# Patient Record
Sex: Male | Born: 1960
Health system: Southern US, Community
[De-identification: ages and names within clinical notes are randomized; demographics above are authoritative.]

## PROBLEM LIST (undated history)

## (undated) DIAGNOSIS — R002 Palpitations: Secondary | ICD-10-CM

## (undated) DIAGNOSIS — I499 Cardiac arrhythmia, unspecified: Secondary | ICD-10-CM

## (undated) DIAGNOSIS — Z87898 Personal history of other specified conditions: Secondary | ICD-10-CM

## (undated) DIAGNOSIS — G4730 Sleep apnea, unspecified: Secondary | ICD-10-CM

## (undated) DIAGNOSIS — M1990 Unspecified osteoarthritis, unspecified site: Secondary | ICD-10-CM

## (undated) DIAGNOSIS — R0601 Orthopnea: Secondary | ICD-10-CM

## (undated) DIAGNOSIS — I10 Essential (primary) hypertension: Secondary | ICD-10-CM

## (undated) DIAGNOSIS — E119 Type 2 diabetes mellitus without complications: Secondary | ICD-10-CM

## (undated) DIAGNOSIS — R609 Edema, unspecified: Secondary | ICD-10-CM

## (undated) HISTORY — PX: HERNIA REPAIR: SHX51

---

## 2000-04-30 ENCOUNTER — Other Ambulatory Visit (HOSPITAL_COMMUNITY): Admission: RE | Admit: 2000-04-30 | Discharge: 2000-05-19 | Payer: Self-pay | Admitting: Psychiatry

## 2014-10-25 ENCOUNTER — Encounter: Payer: Self-pay | Admitting: *Deleted

## 2014-10-26 ENCOUNTER — Ambulatory Visit: Payer: 59 | Admitting: Anesthesiology

## 2014-10-26 ENCOUNTER — Encounter
Admission: RE | Disposition: A | Discharge status: Home / Self Care | Payer: Self-pay | Source: Ambulatory Visit | Attending: Gastroenterology

## 2014-10-26 ENCOUNTER — Ambulatory Visit
Admission: RE | Admit: 2014-10-26 | Discharge: 2014-10-26 | Disposition: A | Discharge status: Home / Self Care | Payer: 59 | Source: Ambulatory Visit | Attending: Gastroenterology | Admitting: Gastroenterology

## 2014-10-26 DIAGNOSIS — G4730 Sleep apnea, unspecified: Secondary | ICD-10-CM | POA: Diagnosis not present

## 2014-10-26 DIAGNOSIS — Z87891 Personal history of nicotine dependence: Secondary | ICD-10-CM | POA: Insufficient documentation

## 2014-10-26 DIAGNOSIS — I10 Essential (primary) hypertension: Secondary | ICD-10-CM | POA: Insufficient documentation

## 2014-10-26 DIAGNOSIS — E119 Type 2 diabetes mellitus without complications: Secondary | ICD-10-CM | POA: Diagnosis not present

## 2014-10-26 DIAGNOSIS — K621 Rectal polyp: Secondary | ICD-10-CM | POA: Insufficient documentation

## 2014-10-26 DIAGNOSIS — D125 Benign neoplasm of sigmoid colon: Secondary | ICD-10-CM | POA: Insufficient documentation

## 2014-10-26 DIAGNOSIS — I4891 Unspecified atrial fibrillation: Secondary | ICD-10-CM | POA: Diagnosis not present

## 2014-10-26 DIAGNOSIS — Z1211 Encounter for screening for malignant neoplasm of colon: Secondary | ICD-10-CM | POA: Insufficient documentation

## 2014-10-26 DIAGNOSIS — K5730 Diverticulosis of large intestine without perforation or abscess without bleeding: Secondary | ICD-10-CM | POA: Insufficient documentation

## 2014-10-26 DIAGNOSIS — Z79899 Other long term (current) drug therapy: Secondary | ICD-10-CM | POA: Diagnosis not present

## 2014-10-26 HISTORY — DX: Sleep apnea, unspecified: G47.30

## 2014-10-26 HISTORY — PX: COLONOSCOPY WITH PROPOFOL: SHX5780

## 2014-10-26 HISTORY — DX: Essential (primary) hypertension: I10

## 2014-10-26 HISTORY — DX: Cardiac arrhythmia, unspecified: I49.9

## 2014-10-26 HISTORY — DX: Type 2 diabetes mellitus without complications: E11.9

## 2014-10-26 LAB — URINE DRUG SCREEN, QUALITATIVE (ARMC ONLY)
Amphetamines, Ur Screen: NOT DETECTED
Barbiturates, Ur Screen: NOT DETECTED
Benzodiazepine, Ur Scrn: NOT DETECTED
COCAINE METABOLITE, UR ~~LOC~~: NOT DETECTED
Cannabinoid 50 Ng, Ur ~~LOC~~: NOT DETECTED
MDMA (Ecstasy)Ur Screen: NOT DETECTED
Methadone Scn, Ur: NOT DETECTED
OPIATE, UR SCREEN: NOT DETECTED
PHENCYCLIDINE (PCP) UR S: NOT DETECTED
TRICYCLIC, UR SCREEN: NOT DETECTED

## 2014-10-26 SURGERY — COLONOSCOPY WITH PROPOFOL
Anesthesia: General

## 2014-10-26 SURGERY — COLONOSCOPY WITH PROPOFOL
Anesthesia: Moderate Sedation

## 2014-10-26 MED ORDER — MIDAZOLAM HCL 5 MG/5ML IJ SOLN
INTRAMUSCULAR | Status: AC
  Filled 2014-10-26: qty 10

## 2014-10-26 MED ORDER — MIDAZOLAM HCL 10 MG/2ML IJ SOLN
INTRAMUSCULAR | Status: DC | PRN
  Administered 2014-10-26: 2 mg via INTRAVENOUS
  Administered 2014-10-26: 5 mg via INTRAVENOUS

## 2014-10-26 MED ORDER — SODIUM CHLORIDE 0.9 % IV SOLN
INTRAVENOUS | Status: DC
  Administered 2014-10-26: 1000 mL via INTRAVENOUS

## 2014-10-26 MED ORDER — FENTANYL CITRATE (PF) 100 MCG/2ML IJ SOLN
INTRAMUSCULAR | Status: DC | PRN
  Administered 2014-10-26 (×2): 50 ug via INTRAVENOUS

## 2014-10-26 MED ORDER — SODIUM CHLORIDE 0.9 % IV SOLN
INTRAVENOUS | Status: DC
Start: 2014-10-26 — End: 2014-10-26
  Administered 2014-10-26: 11:00:00 via INTRAVENOUS

## 2014-10-26 MED ORDER — FENTANYL CITRATE (PF) 100 MCG/2ML IJ SOLN
INTRAMUSCULAR | Status: AC
  Filled 2014-10-26: qty 2

## 2014-10-26 NOTE — Anesthesia Preprocedure Evaluation (Addendum)
Anesthesia Evaluation  Patient identified by MRN, date of birth, ID band Patient awake    Reviewed: Allergy & Precautions, H&P , NPO status , Patient's Chart, lab work & pertinent test results, reviewed documented beta blocker date and time   Airway Mallampati: II  TM Distance: >3 FB Neck ROM: full    Dental no notable dental hx. (+) Teeth Intact   Pulmonary sleep apnea , former smoker,  breath sounds clear to auscultation  Pulmonary exam normal       Cardiovascular hypertension, - Past MI Normal cardiovascular exam+ dysrhythmias Rhythm:regular Rate:Normal     Neuro/Psych negative neurological ROS  negative psych ROS   GI/Hepatic negative GI ROS, Neg liver ROS,   Endo/Other  negative endocrine ROSdiabetes  Renal/GU negative Renal ROS  negative genitourinary   Musculoskeletal   Abdominal   Peds  Hematology negative hematology ROS (+)   Anesthesia Other Findings   Reproductive/Obstetrics negative OB ROS                            Anesthesia Physical Anesthesia Plan  ASA: III  Anesthesia Plan: General and MAC   Post-op Pain Management:    Induction:   Airway Management Planned:   Additional Equipment:   Intra-op Plan:   Post-operative Plan:   Informed Consent: I have reviewed the patients History and Physical, chart, labs and discussed the procedure including the risks, benefits and alternatives for the proposed anesthesia with the patient or authorized representative who has indicated his/her understanding and acceptance.   Dental Advisory Given  Plan Discussed with: Anesthesiologist, CRNA and Surgeon  Anesthesia Plan Comments: (Patient requests to be awake and aware for the procedure)       Anesthesia Quick Evaluation

## 2014-10-26 NOTE — Op Note (Signed)
[facility] Gastroenterology Patient Name: Erik [name] Procedure Date: 10/26/2014 10:19 AM MRN: [id number] Account #: [id number] Date of Birth: [birth year] Admit Type: Outpatient Age: 54 Room: [facility] [place] ENDO ROOM 3 Gender: Male Note Status: Finalized Procedure:         Colonoscopy Indications:       Screening for colorectal malignant neoplasm, This is the                     patient's first colonoscopy Providers:         Gerrit Heck. Rayann Heman, MD Referring MD:      Youlanda Roys. Lovie Macadamia, MD (Referring MD) Medicines:         Fentanyl 100 micrograms IV, Midazolam 4 mg IV Complications:     No immediate complications. Procedure:         Pre-Anesthesia Assessment:                    - Prior to the procedure, a History and Physical was                     performed, and patient medications, allergies and                     sensitivities were reviewed. The patient's tolerance of                     previous anesthesia was reviewed.                    After obtaining informed consent, the colonoscope was                     passed under direct vision. Throughout the procedure, the                     patient's blood pressure, pulse, and oxygen saturations                     were monitored continuously. The Colonoscope was                     introduced through the anus and advanced to the the cecum,                     identified by appendiceal orifice and ileocecal valve. The                     colonoscopy was somewhat difficult due to significant                     looping. Successful completion of the procedure was aided                     by changing the patient to a prone position. The patient                     tolerated the procedure well. The quality of the bowel                     preparation was good except the cecum and ascending colon                     requred extensive lavage ( was good post lavage) Findings:      The perianal and digital  rectal  examinations were normal.      Many small-mouthed diverticula were found in the sigmoid colon.      A 3 mm polyp was found in the sigmoid colon. The polyp was sessile. The       polyp was removed with a jumbo cold forceps. Resection and retrieval       were complete.      A 2 mm polyp was found in the rectum. The polyp was sessile. The polyp       was removed with a jumbo cold forceps. Resection and retrieval were       complete.      The exam was otherwise without abnormality on direct and retroflexion       views. Impression:        - Diverticulosis in the sigmoid colon.                    - One 3 mm polyp in the sigmoid colon. Resected and                     retrieved.                    - One 2 mm polyp in the rectum. Resected and retrieved.                    - The examination was otherwise normal on direct and                     retroflexion views. Recommendation:    - Observe patient in GI recovery unit.                    - High fiber diet.                    - Continue present medications.                    - Await pathology results.                    - Repeat colonoscopy for surveillance based on pathology                     results, 5 years if adenomatous, otherwise repeat in 10                     years.                    - Return to referring physician.                    - The findings and recommendations were discussed with the                     patient.                    - The findings and recommendations were discussed with the                     patient's family. Procedure Code(s): --- Professional ---                    [phone], Colonoscopy, flexible; with biopsy, single or  multiple CPT copyright 2014 American Medical Association. All rights reserved. The codes documented in this report are preliminary and upon coder review may  be revised to meet current compliance requirements. Mellody Life, MD 10/26/2014 11:27:49 AM This report has  been signed electronically. Number of Addenda: 0 Note Initiated On: 10/26/2014 10:19 AM Scope Withdrawal Time: 0 hours 18 minutes 12 seconds  Total Procedure Duration: 0 hours 31 minutes 46 seconds       [facility]

## 2014-10-26 NOTE — Discharge Instructions (Signed)

## 2014-10-26 NOTE — H&P (Signed)
  Primary Care Physician:  Erik Pitch, MD  Pre-Procedure History & Physical: HPI:  Erik [name] is a 54 y.o. male is here for an colonoscopy.   Past Medical History  Diagnosis Date  . Hypertension   . Diabetes mellitus without complication   . Sleep apnea   . Dysrhythmia     a-fib    Past Surgical History  Procedure Laterality Date  . Hernia repair      Prior to Admission medications   Medication Sig Start Date End Date Taking? Authorizing Provider  amLODipine (NORVASC) 10 MG tablet Take 10 mg by mouth daily.   Yes Historical Provider, MD  glucosamine-chondroitin 500-400 MG tablet Take 1 tablet by mouth 3 (three) times daily.   Yes Historical Provider, MD  lisinopril-hydrochlorothiazide (PRINZIDE,ZESTORETIC) 20-12.5 MG per tablet Take 1 tablet by mouth daily.   Yes Historical Provider, MD  metFORMIN (GLUCOPHAGE) 500 MG tablet Take by mouth 2 (two) times daily with a meal.   Yes Historical Provider, MD  metoprolol (LOPRESSOR) 100 MG tablet Take 100 mg by mouth 2 (two) times daily.   Yes Historical Provider, MD  Multiple Vitamins-Minerals (MULTIVITAMIN WITH MINERALS) tablet Take 1 tablet by mouth daily.   Yes Historical Provider, MD  simvastatin (ZOCOR) 20 MG tablet Take 20 mg by mouth daily.   Yes Historical Provider, MD    Allergies as of 10/18/2014  . (Not on File)    History reviewed. No pertinent family history.  History   Social History  . Marital Status: Married    Spouse Name: N/A  . Number of Children: N/A  . Years of Education: N/A   Occupational History  . Not on file.   Social History Main Topics  . Smoking status: Former Research scientist (life sciences)  . Smokeless tobacco: Not on file  . Alcohol Use: Not on file  . Drug Use: Not on file  . Sexual Activity: Not on file   Other Topics Concern  . Not on file   Social History Narrative     Physical Exam: BP 146/86 mmHg  Pulse 74  Temp(Src) 97.9 F (36.6 C) (Tympanic)  Resp 18  Ht 6\' 2"  (1.88 m)  Wt  158.759 kg (350 lb)  BMI 44.92 kg/m2  SpO2 97% General:   Alert,  pleasant and cooperative in NAD Head:  Normocephalic and atraumatic. Neck:  Supple; no masses or thyromegaly. Lungs:  Clear throughout to auscultation.    Heart:  Regular rate and rhythm. Abdomen:  Soft, nontender and nondistended. Normal bowel sounds, without guarding, and without rebound.   Neurologic:  Alert and  oriented x4;  grossly normal neurologically.  Impression/Plan: Erik [name] is here for an colonoscopy to be performed for screening.   Risks, benefits, limitations, and alternatives regarding  colonoscopy have been reviewed with the patient.  Questions have been answered.  All parties agreeable.   Josefine Class, MD  10/26/2014, 10:23 AM

## 2014-10-27 ENCOUNTER — Encounter: Payer: Self-pay | Admitting: Gastroenterology

## 2014-10-27 LAB — SURGICAL PATHOLOGY

## 2015-07-17 ENCOUNTER — Encounter: Payer: 59 | Attending: Family Medicine | Admitting: Dietician

## 2015-07-17 ENCOUNTER — Encounter: Payer: Self-pay | Admitting: Dietician

## 2015-07-17 VITALS — Ht 74.0 in | Wt 329.9 lb

## 2015-07-17 DIAGNOSIS — E119 Type 2 diabetes mellitus without complications: Secondary | ICD-10-CM | POA: Diagnosis present

## 2015-07-17 DIAGNOSIS — E669 Obesity, unspecified: Secondary | ICD-10-CM | POA: Diagnosis present

## 2015-07-17 NOTE — Patient Instructions (Signed)
Balance meals with protein, 3-4 servings of carbohydrate and non-starchy vegetables with small amounts of added fat. Include at least 1-2 starch servings per meal. Include only 1 fruit serving per meal and snack. Continue consistent exercise of 6 days per week.

## 2015-07-17 NOTE — Progress Notes (Signed)
Medical Nutrition Therapy: Visit start time: 8:50  end time: 9:50am  Assessment:  Diagnosis: obesity, Type 2 diabetes Past medical history: hyperlipidemia, hypertension, atrial fibrillation, sleep apnea. Patient has history of alcohol and drug abuse and reports he has been alcohol and drug free for 15 months. He attends alcoholics and narcotics anonymous. Psychosocial issues/ stress concerns: Patient rates his stress as "moderate" and indicates "ok" as to how well he is dealing with his stress.  Preferred learning method:  . Auditory    . Visual . Hands-on Current weight: 329.9 lbs  Height: 74 in Medications, supplements: see list Progress and evaluation:  Patient in for initial medical nutrition therapy appointment. He reports history of "on and off" dieting. He reports a highest weight of 425 lbs "years ago". Reports a weight loss of 20 lbs since first of this year. He reports that since being alcohol and drug free he has turned his addiction to food. He joined [employer] and has found this helpful. He uses a Fitbit to record food/beverage intake. His food records indicate calories ranging from 1300 -2600 per day. He is possibly over-restricting especially in his carbohydrate intake on some days which could increase risk of rebound effect and excessive intake. He states he tries to not eat after 3:30pm and goes to bed by 9:30kpm.  He has eliminated most sweets and most of his food choices are providing nutrients. His goal for this appointment is "tweeking my program". Reports his most recent HgA1c was 9.4 and his fasting blood sugars are now in 100-160 range, a big improvement since the 1st of the year. Physical activity: walking 6 days per week for 1-1.5 hours  Dietary Intake:  Usual eating pattern includes 3 meals and 2-3 snacks per day. Dining out frequency: 5-6 meals per week.  Breakfast: oatmeal (1 pkg), 1/2 cup blueberries, 1 large banana, 1 egg Snack: kiwi, banana Lunch: bacon  grilled chicken salad, fruit Snack:pecans, fruit Supper: broccoli, fruit, 1 egg  Nutrition Care Education:  Weight control: Discussed components of successful weight loss including diet, exercise and stress/emotional support and commended on including all 3 in his efforts. Discussed the need for adequate calories and balance of protein, carbohydrate, fat and non-starchy vegetables. Encouraged to focus on food group servings needed to meet basic nutrient needs verses restriction. Diabetes:  Instructed on carbohydrate counting and used food guide plate and "Planning a Balanced Meal" and menu examples to show examples of balancing nutrients and servings needed to meet basic nutrient needs. Recommended meal plan based on minimum of 1900 -2000 calories per day to avoid over-restricting. Nutritional Diagnosis:  NI-5.8.1 Inadequate carbohydrate intake As related to over-restricting at meals and snacks.  As evidenced by diet history..  Intervention:  Balance meals with protein, 3-4 servings of carbohydrate and non-starchy vegetables with small amounts of added fat. Include at least 1-2 starch servings per meal. Include only 1 fruit serving per meal and snack. Continue consistent exercise of 6 days per week.  Education Materials given:  . Plate Planner . Food lists/ Planning A Balanced Meal . Sample meal pattern/ menus . Goals/ instructions  Learner/ who was taught:  . Patient  Level of understanding: . Partial understanding; needs review/ practice Learning barriers: . None Follow-up: exercise, weight, diet  08/14/15 at 9:00am

## 2015-08-14 ENCOUNTER — Ambulatory Visit: Payer: 59 | Admitting: Dietician

## 2015-09-06 ENCOUNTER — Encounter: Payer: Self-pay | Admitting: *Deleted

## 2015-09-09 NOTE — H&P (Signed)
See scanned note.

## 2015-09-10 ENCOUNTER — Ambulatory Visit
Admission: RE | Admit: 2015-09-10 | Discharge: 2015-09-10 | Disposition: A | Discharge status: Home / Self Care | Payer: 59 | Source: Ambulatory Visit | Attending: Ophthalmology | Admitting: Ophthalmology

## 2015-09-10 ENCOUNTER — Encounter: Payer: Self-pay | Admitting: *Deleted

## 2015-09-10 ENCOUNTER — Ambulatory Visit: Payer: 59 | Admitting: Anesthesiology

## 2015-09-10 ENCOUNTER — Encounter
Admission: RE | Disposition: A | Discharge status: Home / Self Care | Payer: Self-pay | Source: Ambulatory Visit | Attending: Ophthalmology

## 2015-09-10 DIAGNOSIS — M7989 Other specified soft tissue disorders: Secondary | ICD-10-CM | POA: Insufficient documentation

## 2015-09-10 DIAGNOSIS — R0601 Orthopnea: Secondary | ICD-10-CM | POA: Diagnosis not present

## 2015-09-10 DIAGNOSIS — I4891 Unspecified atrial fibrillation: Secondary | ICD-10-CM | POA: Diagnosis not present

## 2015-09-10 DIAGNOSIS — Z87891 Personal history of nicotine dependence: Secondary | ICD-10-CM | POA: Insufficient documentation

## 2015-09-10 DIAGNOSIS — H25042 Posterior subcapsular polar age-related cataract, left eye: Secondary | ICD-10-CM | POA: Insufficient documentation

## 2015-09-10 DIAGNOSIS — G4730 Sleep apnea, unspecified: Secondary | ICD-10-CM | POA: Insufficient documentation

## 2015-09-10 DIAGNOSIS — I10 Essential (primary) hypertension: Secondary | ICD-10-CM | POA: Diagnosis not present

## 2015-09-10 DIAGNOSIS — E7800 Pure hypercholesterolemia, unspecified: Secondary | ICD-10-CM | POA: Diagnosis not present

## 2015-09-10 DIAGNOSIS — H268 Other specified cataract: Secondary | ICD-10-CM | POA: Diagnosis present

## 2015-09-10 DIAGNOSIS — H2512 Age-related nuclear cataract, left eye: Secondary | ICD-10-CM | POA: Insufficient documentation

## 2015-09-10 DIAGNOSIS — R002 Palpitations: Secondary | ICD-10-CM | POA: Insufficient documentation

## 2015-09-10 DIAGNOSIS — M1990 Unspecified osteoarthritis, unspecified site: Secondary | ICD-10-CM | POA: Insufficient documentation

## 2015-09-10 DIAGNOSIS — E119 Type 2 diabetes mellitus without complications: Secondary | ICD-10-CM | POA: Diagnosis not present

## 2015-09-10 HISTORY — DX: Palpitations: R00.2

## 2015-09-10 HISTORY — DX: Edema, unspecified: R60.9

## 2015-09-10 HISTORY — DX: Unspecified osteoarthritis, unspecified site: M19.90

## 2015-09-10 HISTORY — PX: CATARACT EXTRACTION W/PHACO: SHX586

## 2015-09-10 HISTORY — DX: Personal history of other specified conditions: Z87.898

## 2015-09-10 LAB — GLUCOSE, CAPILLARY: Glucose-Capillary: 156 mg/dL — ABNORMAL HIGH (ref 65–99)

## 2015-09-10 SURGERY — PHACOEMULSIFICATION, CATARACT, WITH IOL INSERTION
Anesthesia: Monitor Anesthesia Care | Site: Eye | Laterality: Left | Wound class: Clean

## 2015-09-10 MED ORDER — CEFUROXIME OPHTHALMIC INJECTION 1 MG/0.1 ML
INJECTION | OPHTHALMIC | Status: AC
  Filled 2015-09-10: qty 0.1

## 2015-09-10 MED ORDER — PHENYLEPHRINE HCL 10 % OP SOLN
OPHTHALMIC | Status: AC
  Administered 2015-09-10: 1 [drp] via OPHTHALMIC
  Filled 2015-09-10: qty 5

## 2015-09-10 MED ORDER — CYCLOPENTOLATE HCL 2 % OP SOLN
1.0000 [drp] | OPHTHALMIC | Status: AC | PRN
  Administered 2015-09-10 (×4): 1 [drp] via OPHTHALMIC

## 2015-09-10 MED ORDER — EPINEPHRINE HCL 1 MG/ML IJ SOLN
INTRAOCULAR | Status: DC | PRN
  Administered 2015-09-10: 1 mL via OPHTHALMIC

## 2015-09-10 MED ORDER — BUPIVACAINE HCL (PF) 0.75 % IJ SOLN
INTRAMUSCULAR | Status: AC
  Filled 2015-09-10: qty 10

## 2015-09-10 MED ORDER — POVIDONE-IODINE 5 % OP SOLN
OPHTHALMIC | Status: AC
  Filled 2015-09-10: qty 30

## 2015-09-10 MED ORDER — MIDAZOLAM HCL 5 MG/5ML IJ SOLN
INTRAMUSCULAR | Status: DC | PRN
  Administered 2015-09-10: 1 mg via INTRAVENOUS

## 2015-09-10 MED ORDER — TETRACAINE HCL 0.5 % OP SOLN
OPHTHALMIC | Status: AC
  Filled 2015-09-10: qty 2

## 2015-09-10 MED ORDER — HYALURONIDASE HUMAN 150 UNIT/ML IJ SOLN
INTRAMUSCULAR | Status: AC
  Filled 2015-09-10: qty 1

## 2015-09-10 MED ORDER — CARBACHOL 0.01 % IO SOLN
INTRAOCULAR | Status: DC | PRN
  Administered 2015-09-10: .5 mL via INTRAOCULAR

## 2015-09-10 MED ORDER — MOXIFLOXACIN HCL 0.5 % OP SOLN
1.0000 [drp] | OPHTHALMIC | Status: DC | PRN
  Administered 2015-09-10 (×3): 1 [drp] via OPHTHALMIC

## 2015-09-10 MED ORDER — ALFENTANIL 500 MCG/ML IJ INJ
INJECTION | INTRAMUSCULAR | Status: DC | PRN
  Administered 2015-09-10: 500 ug via INTRAVENOUS

## 2015-09-10 MED ORDER — EPINEPHRINE HCL 1 MG/ML IJ SOLN
INTRAMUSCULAR | Status: AC
  Filled 2015-09-10: qty 2

## 2015-09-10 MED ORDER — MOXIFLOXACIN HCL 0.5 % OP SOLN
OPHTHALMIC | Status: AC
  Administered 2015-09-10: 1 [drp] via OPHTHALMIC
  Filled 2015-09-10: qty 3

## 2015-09-10 MED ORDER — LIDOCAINE HCL (PF) 4 % IJ SOLN
INTRAMUSCULAR | Status: AC
  Filled 2015-09-10: qty 5

## 2015-09-10 MED ORDER — MOXIFLOXACIN HCL 0.5 % OP SOLN
OPHTHALMIC | Status: DC | PRN
  Administered 2015-09-10: 1 [drp] via OPHTHALMIC

## 2015-09-10 MED ORDER — LIDOCAINE HCL (PF) 4 % IJ SOLN
INTRAMUSCULAR | Status: DC | PRN
  Administered 2015-09-10: 4 mL via OPHTHALMIC

## 2015-09-10 MED ORDER — SODIUM CHLORIDE 0.9 % IV SOLN
INTRAVENOUS | Status: DC
  Administered 2015-09-10: 09:00:00 via INTRAVENOUS

## 2015-09-10 MED ORDER — NA CHONDROIT SULF-NA HYALURON 40-17 MG/ML IO SOLN
INTRAOCULAR | Status: AC
  Filled 2015-09-10: qty 1

## 2015-09-10 MED ORDER — PHENYLEPHRINE HCL 10 % OP SOLN
1.0000 [drp] | OPHTHALMIC | Status: AC | PRN
  Administered 2015-09-10 (×4): 1 [drp] via OPHTHALMIC

## 2015-09-10 MED ORDER — NA CHONDROIT SULF-NA HYALURON 40-17 MG/ML IO SOLN
INTRAOCULAR | Status: DC | PRN
  Administered 2015-09-10: 1 mL via INTRAOCULAR

## 2015-09-10 MED ORDER — CEFUROXIME OPHTHALMIC INJECTION 1 MG/0.1 ML
INJECTION | OPHTHALMIC | Status: DC | PRN
  Administered 2015-09-10: .1 mL via INTRACAMERAL

## 2015-09-10 MED ORDER — POVIDONE-IODINE 5 % OP SOLN
OPHTHALMIC | Status: DC | PRN
  Administered 2015-09-10: 1 via OPHTHALMIC

## 2015-09-10 MED ORDER — TETRACAINE HCL 0.5 % OP SOLN
OPHTHALMIC | Status: DC | PRN
  Administered 2015-09-10: 1 [drp] via OPHTHALMIC

## 2015-09-10 MED ORDER — CYCLOPENTOLATE HCL 2 % OP SOLN
OPHTHALMIC | Status: AC
  Administered 2015-09-10: 1 [drp] via OPHTHALMIC
  Filled 2015-09-10: qty 2

## 2015-09-10 MED ORDER — LIDOCAINE HCL (PF) 4 % IJ SOLN
INTRAOCULAR | Status: DC | PRN
  Administered 2015-09-10: .5 mL via OPHTHALMIC

## 2015-09-10 SURGICAL SUPPLY — 30 items
CANNULA ANT/CHMB 27G (MISCELLANEOUS) ×1 IMPLANT
CANNULA ANT/CHMB 27GA (MISCELLANEOUS) ×2 IMPLANT
CORD BIP STRL DISP 12FT (MISCELLANEOUS) ×2 IMPLANT
CUP MEDICINE 2OZ PLAST GRAD ST (MISCELLANEOUS) ×2 IMPLANT
DRAPE XRAY CASSETTE 23X24 (DRAPES) ×2 IMPLANT
ERASER HMR WETFIELD 18G (MISCELLANEOUS) ×2 IMPLANT
GLOVE BIO SURGEON STRL SZ8 (GLOVE) ×2 IMPLANT
GLOVE SURG LX 6.5 MICRO (GLOVE) ×1
GLOVE SURG LX 8.0 MICRO (GLOVE) ×1
GLOVE SURG LX STRL 6.5 MICRO (GLOVE) ×1 IMPLANT
GLOVE SURG LX STRL 8.0 MICRO (GLOVE) ×1 IMPLANT
GOWN STRL REUS W/ TWL LRG LVL3 (GOWN DISPOSABLE) ×1 IMPLANT
GOWN STRL REUS W/ TWL XL LVL3 (GOWN DISPOSABLE) ×1 IMPLANT
GOWN STRL REUS W/TWL LRG LVL3 (GOWN DISPOSABLE) ×2
GOWN STRL REUS W/TWL XL LVL3 (GOWN DISPOSABLE) ×2
LENS IOL ACRYSOF IQ 20.0 (Intraocular Lens) ×1 IMPLANT
PACK CATARACT (MISCELLANEOUS) ×2 IMPLANT
PACK CATARACT DINGLEDEIN LX (MISCELLANEOUS) ×2 IMPLANT
PACK EYE AFTER SURG (MISCELLANEOUS) ×2 IMPLANT
SHLD EYE VISITEC  UNIV (MISCELLANEOUS) ×2 IMPLANT
SOL BSS BAG (MISCELLANEOUS) ×2
SOL PREP PVP 2OZ (MISCELLANEOUS) ×2
SOLUTION BSS BAG (MISCELLANEOUS) ×1 IMPLANT
SOLUTION PREP PVP 2OZ (MISCELLANEOUS) ×1 IMPLANT
SUT SILK 5-0 (SUTURE) ×2 IMPLANT
SYR 3ML LL SCALE MARK (SYRINGE) ×2 IMPLANT
SYR 5ML LL (SYRINGE) ×2 IMPLANT
SYR TB 1ML 27GX1/2 LL (SYRINGE) ×2 IMPLANT
WATER STERILE IRR 1000ML POUR (IV SOLUTION) ×2 IMPLANT
WIPE NON LINTING 3.25X3.25 (MISCELLANEOUS) ×2 IMPLANT

## 2015-09-10 NOTE — Anesthesia Postprocedure Evaluation (Signed)
Anesthesia Post Note  Patient: Erik [name]  Procedure(s) Performed: Procedure(s) (LRB): CATARACT EXTRACTION PHACO AND INTRAOCULAR LENS PLACEMENT ([facility]) (Left)  Patient location during evaluation: PACU Anesthesia Type: MAC Level of consciousness: awake and alert and oriented Pain management: satisfactory to patient Vital Signs Assessment: post-procedure vital signs reviewed and stable Respiratory status: respiratory function stable Cardiovascular status: stable Anesthetic complications: no    Last Vitals:  Filed Vitals:   09/10/15 0821  BP: 145/81  Pulse: 66  Temp: 37 C  Resp: 18    Last Pain: There were no vitals filed for this visit.               Silvana Newness A

## 2015-09-10 NOTE — Discharge Instructions (Signed)
AMBULATORY SURGERY  DISCHARGE INSTRUCTIONS   1) The drugs that you were given will stay in your system until tomorrow so for the next 24 hours you should not:  A) Drive an automobile B) Make any legal decisions C) Drink any alcoholic beverage   2) You may resume regular meals tomorrow.  Today it is better to start with liquids and gradually work up to solid foods.  You may eat anything you prefer, but it is better to start with liquids, then soup and crackers, and gradually work up to solid foods.   3) Please notify your doctor immediately if you have any unusual bleeding, trouble breathing, redness and pain at the surgery site, drainage, fever, or pain not relieved by medication.    4) Additional Instructions:        Please contact your physician with any problems or Same Day Surgery at [phone], Monday through Friday 6 am to 4 pm, or [place] at [facility] number at [phone]. Eye Surgery Discharge Instructions  Expect mild scratchy sensation or mild soreness. DO NOT RUB YOUR EYE!  The day of surgery:  Minimal physical activity, but bed rest is not required  No reading, computer work, or close hand work  No bending, lifting, or straining.  May watch TV  For 24 hours:  No driving, legal decisions, or alcoholic beverages  Safety precautions  Eat anything you prefer: It is better to start with liquids, then soup then solid foods.  _____ Eye patch should be worn until postoperative exam tomorrow.  ____ Solar shield eyeglasses should be worn for comfort in the sunlight/patch while sleeping  Resume all regular medications including aspirin or Coumadin if these were discontinued prior to surgery. You may shower, bathe, shave, or wash your hair. Tylenol may be taken for mild discomfort.  Call your doctor if you experience significant pain, nausea, or vomiting, fever > 101 or other signs of infection. [phone] or [phone] Specific  instructions:  Follow-up Information    Follow up with Estill Cotta, MD.   Specialty:  Ophthalmology   Why:  follow up 5/23 at [place] information:   [address]   [place] [address] [phone]

## 2015-09-10 NOTE — Interval H&P Note (Signed)
History and Physical Interval Note:  09/10/2015 9:32 AM  Erik [name]  has presented today for surgery, with the diagnosis of cataract  The various methods of treatment have been discussed with the patient and family. After consideration of risks, benefits and other options for treatment, the patient has consented to  Procedure(s): CATARACT EXTRACTION PHACO AND INTRAOCULAR LENS PLACEMENT ([place]) (Left) as a surgical intervention .  The patient's history has been reviewed, patient examined, no change in status, stable for surgery.  I have reviewed the patient's chart and labs.  Questions were answered to the patient's satisfaction.     Mihcael Ledee

## 2015-09-10 NOTE — Op Note (Signed)
Date of Surgery: 09/10/2015 Date of Dictation: 09/10/2015 10:23 AM Pre-operative Diagnosis:  Nuclear Sclerotic Cataract and Posterior Subcapsular Cataract left Eye Post-operative Diagnosis: same Procedure performed: Extra-capsular Cataract Extraction (ECCE) with placement of a posterior chamber intraocular lens (IOL) left Eye IOL:  Implant Name Type Inv. Item Serial No. Manufacturer Lot No. LRB No. Used  LENS IOL ACRYSOF IQ 20.0 - [id number] Intraocular Lens LENS IOL ACRYSOF IQ 20.0 [id number] ALCON   Left 1   Anesthesia: 2% Lidocaine and 4% Marcaine in a 50/50 mixture with 10 unites/ml of Hylenex given as a peribulbar Anesthesiologist: Anesthesiologist: Gunnar Fusi, MD CRNA: Silvana Newness, CRNA Complications: none Estimated Blood Loss: less than 1 ml  Description of procedure:  The patient was given anesthesia and sedation via intravenous access. The patient was then prepped and draped in the usual fashion. A 25-gauge needle was bent for initiating the capsulorhexis. A 5-0 silk suture was placed through the conjunctiva superior and inferiorly to serve as bridle sutures. Hemostasis was obtained at the superior limbus using an eraser cautery. A partial thickness groove was made at the anterior surgical limbus with a 64 Beaver blade and this was dissected anteriorly with an [employer]aya. The anterior chamber was entered at 10 o'clock with a 1.0 mm paracentesis knife and through the lamellar dissection with a 2.6 mm Alcon keratome. Epi-Shugarcaine 0.5 CC [9 cc BSS Plus (Alcon), 3 cc 4% preservative-free lidocaine (Hospira) and 4 cc 1:1000 preservative-free, bisulfite-free epinephrine] was injected into the anterior chamber via the paracentesis tract. Epi-Shugarcaine 0.5 CC [9 cc BSS Plus (Alcon), 3 cc 4% preservative-free lidocaine (Hospira) and 4 cc 1:1000 preservative-free, bisulfite-free epinephrine] was injected into the anterior chamber via the paracentesis tract. DiscoVisc was  injected to replace the aqueous and a continuous tear curvilinear capsulorhexis was performed using a bent 25-gauge needle.  Balance salt on a syringe was used to perform hydro-dissection and phacoemulsification was carried out using a divide and conquer technique. Procedure(s) with comments: CATARACT EXTRACTION PHACO AND INTRAOCULAR LENS PLACEMENT (IOC) (Left) - [place] 00:51 AP% 15.8 CDE 16.04 fluid pack lot # [id number]. Irrigation/aspiration was used to remove the residual cortex and the capsular bag was inflated with DiscoVisc. The intraocular lens was inserted into the capsular bag using a pre-loaded UltraSert Delivery System. Irrigation/aspiration was used to remove the residual DiscoVisc. The wound was inflated with balanced salt and checked for leaks. None were found. Miostat was injected via the paracentesis track and 0.1 ml of cefuroxime containing 1 mg of drug  was injected via the paracentesis track. The wound was checked for leaks again and none were found.   The bridal sutures were removed and two drops of Vigamox were placed on the eye. An eye shield was placed to protect the eye and the patient was discharged to the recovery area in good condition.   Lisaann Atha MD

## 2015-09-10 NOTE — Transfer of Care (Signed)
Immediate Anesthesia Transfer of Care Note  Patient: Dmitriy Grabe  Procedure(s) Performed: Procedure(s) with comments: CATARACT EXTRACTION PHACO AND INTRAOCULAR LENS PLACEMENT (IOC) (Left) - [place] 00:51 AP% 15.8 CDE 16.04 fluid pack lot # [id number]  Patient Location: PACU  Anesthesia Type:MAC  Level of Consciousness: awake, alert , oriented and patient cooperative  Airway & Oxygen Therapy: Patient Spontanous Breathing  Post-op Assessment: Report given to RN, Post -op Vital signs reviewed and stable and Patient moving all extremities X 4  Post vital signs: Reviewed and stable  Last Vitals:  Filed Vitals:   09/10/15 0821  BP: 145/81  Pulse: 66  Temp: 37 C  Resp: 18    Last Pain: There were no vitals filed for this visit.       Complications: No apparent anesthesia complications

## 2015-09-10 NOTE — Anesthesia Preprocedure Evaluation (Signed)
Anesthesia Evaluation  Patient identified by MRN, date of birth, ID band Patient awake    Reviewed: Allergy & Precautions, NPO status , Patient's Chart, lab work & pertinent test results  Airway Mallampati: II       Dental   Pulmonary neg pulmonary ROS, sleep apnea , former smoker,           Cardiovascular hypertension, Pt. on medications + dysrhythmias Atrial Fibrillation      Neuro/Psych negative neurological ROS     GI/Hepatic Neg liver ROS,   Endo/Other  diabetes, Type 2, Oral Hypoglycemic Agents  Renal/GU negative Renal ROS     Musculoskeletal   Abdominal   Peds  Hematology negative hematology ROS (+)   Anesthesia Other Findings   Reproductive/Obstetrics                             Anesthesia Physical Anesthesia Plan  ASA: II  Anesthesia Plan: MAC   Post-op Pain Management:    Induction: Intravenous  Airway Management Planned: Nasal Cannula  Additional Equipment:   Intra-op Plan:   Post-operative Plan:   Informed Consent: I have reviewed the patients History and Physical, chart, labs and discussed the procedure including the risks, benefits and alternatives for the proposed anesthesia with the patient or authorized representative who has indicated his/her understanding and acceptance.     Plan Discussed with:   Anesthesia Plan Comments:         Anesthesia Quick Evaluation

## 2015-09-18 ENCOUNTER — Ambulatory Visit: Payer: 59 | Admitting: Dietician

## 2015-09-26 ENCOUNTER — Encounter: Payer: Self-pay | Admitting: *Deleted

## 2015-09-30 NOTE — H&P (Signed)
See scanned note.

## 2015-10-01 ENCOUNTER — Ambulatory Visit: Payer: 59 | Admitting: Anesthesiology

## 2015-10-01 ENCOUNTER — Ambulatory Visit
Admission: RE | Admit: 2015-10-01 | Discharge: 2015-10-01 | Disposition: A | Discharge status: Home / Self Care | Payer: 59 | Source: Ambulatory Visit | Attending: Ophthalmology | Admitting: Ophthalmology

## 2015-10-01 ENCOUNTER — Encounter: Payer: Self-pay | Admitting: *Deleted

## 2015-10-01 ENCOUNTER — Encounter
Admission: RE | Disposition: A | Discharge status: Home / Self Care | Payer: Self-pay | Source: Ambulatory Visit | Attending: Ophthalmology

## 2015-10-01 DIAGNOSIS — E7800 Pure hypercholesterolemia, unspecified: Secondary | ICD-10-CM | POA: Diagnosis not present

## 2015-10-01 DIAGNOSIS — Z87891 Personal history of nicotine dependence: Secondary | ICD-10-CM | POA: Diagnosis not present

## 2015-10-01 DIAGNOSIS — H2511 Age-related nuclear cataract, right eye: Secondary | ICD-10-CM | POA: Diagnosis not present

## 2015-10-01 DIAGNOSIS — Z9889 Other specified postprocedural states: Secondary | ICD-10-CM | POA: Insufficient documentation

## 2015-10-01 DIAGNOSIS — H269 Unspecified cataract: Secondary | ICD-10-CM | POA: Diagnosis present

## 2015-10-01 DIAGNOSIS — M1990 Unspecified osteoarthritis, unspecified site: Secondary | ICD-10-CM | POA: Insufficient documentation

## 2015-10-01 DIAGNOSIS — E119 Type 2 diabetes mellitus without complications: Secondary | ICD-10-CM | POA: Diagnosis not present

## 2015-10-01 DIAGNOSIS — G4730 Sleep apnea, unspecified: Secondary | ICD-10-CM | POA: Insufficient documentation

## 2015-10-01 DIAGNOSIS — H25041 Posterior subcapsular polar age-related cataract, right eye: Secondary | ICD-10-CM | POA: Diagnosis not present

## 2015-10-01 DIAGNOSIS — Z79899 Other long term (current) drug therapy: Secondary | ICD-10-CM | POA: Diagnosis not present

## 2015-10-01 DIAGNOSIS — I4891 Unspecified atrial fibrillation: Secondary | ICD-10-CM | POA: Insufficient documentation

## 2015-10-01 DIAGNOSIS — Z7984 Long term (current) use of oral hypoglycemic drugs: Secondary | ICD-10-CM | POA: Diagnosis not present

## 2015-10-01 HISTORY — DX: Orthopnea: R06.01

## 2015-10-01 HISTORY — PX: CATARACT EXTRACTION W/PHACO: SHX586

## 2015-10-01 LAB — GLUCOSE, CAPILLARY: GLUCOSE-CAPILLARY: 170 mg/dL — AB (ref 65–99)

## 2015-10-01 SURGERY — PHACOEMULSIFICATION, CATARACT, WITH IOL INSERTION
Anesthesia: Monitor Anesthesia Care | Site: Eye | Laterality: Right | Wound class: Clean

## 2015-10-01 MED ORDER — POVIDONE-IODINE 5 % OP SOLN
OPHTHALMIC | Status: DC | PRN
  Administered 2015-10-01: 1 via OPHTHALMIC

## 2015-10-01 MED ORDER — SODIUM CHLORIDE 0.9 % IV SOLN
INTRAVENOUS | Status: DC
  Administered 2015-10-01 (×2): via INTRAVENOUS

## 2015-10-01 MED ORDER — ALFENTANIL 500 MCG/ML IJ INJ
INJECTION | INTRAMUSCULAR | Status: DC | PRN
Start: 2015-10-01 — End: 2015-10-01
  Administered 2015-10-01: 750 ug via INTRAVENOUS

## 2015-10-01 MED ORDER — MOXIFLOXACIN HCL 0.5 % OP SOLN
OPHTHALMIC | Status: AC
  Filled 2015-10-01: qty 3

## 2015-10-01 MED ORDER — LIDOCAINE HCL (PF) 4 % IJ SOLN
INTRAMUSCULAR | Status: AC
  Filled 2015-10-01: qty 5

## 2015-10-01 MED ORDER — MOXIFLOXACIN HCL 0.5 % OP SOLN
OPHTHALMIC | Status: DC | PRN
  Administered 2015-10-01: 1 [drp] via OPHTHALMIC

## 2015-10-01 MED ORDER — EPINEPHRINE HCL 1 MG/ML IJ SOLN
INTRAOCULAR | Status: DC | PRN
  Administered 2015-10-01: 09:00:00 via OPHTHALMIC

## 2015-10-01 MED ORDER — CYCLOPENTOLATE HCL 2 % OP SOLN
OPHTHALMIC | Status: AC
  Filled 2015-10-01: qty 2

## 2015-10-01 MED ORDER — CYCLOPENTOLATE HCL 2 % OP SOLN
1.0000 [drp] | OPHTHALMIC | Status: DC | PRN
  Administered 2015-10-01: 1 [drp] via OPHTHALMIC

## 2015-10-01 MED ORDER — TETRACAINE HCL 0.5 % OP SOLN
OPHTHALMIC | Status: DC | PRN
  Administered 2015-10-01: 1 [drp] via OPHTHALMIC

## 2015-10-01 MED ORDER — PHENYLEPHRINE HCL 10 % OP SOLN
OPHTHALMIC | Status: AC
  Filled 2015-10-01: qty 5

## 2015-10-01 MED ORDER — EPINEPHRINE HCL 1 MG/ML IJ SOLN
INTRAMUSCULAR | Status: AC
  Filled 2015-10-01: qty 1

## 2015-10-01 MED ORDER — LIDOCAINE HCL (PF) 4 % IJ SOLN
INTRAOCULAR | Status: DC | PRN
  Administered 2015-10-01: 09:00:00 via OPHTHALMIC

## 2015-10-01 MED ORDER — HYALURONIDASE HUMAN 150 UNIT/ML IJ SOLN
INTRAMUSCULAR | Status: AC
  Filled 2015-10-01: qty 1

## 2015-10-01 MED ORDER — CEFUROXIME OPHTHALMIC INJECTION 1 MG/0.1 ML
INJECTION | OPHTHALMIC | Status: AC
  Filled 2015-10-01: qty 0.1

## 2015-10-01 MED ORDER — TETRACAINE HCL 0.5 % OP SOLN
OPHTHALMIC | Status: AC
  Filled 2015-10-01: qty 2

## 2015-10-01 MED ORDER — MIDAZOLAM HCL 2 MG/2ML IJ SOLN
INTRAMUSCULAR | Status: DC | PRN
  Administered 2015-10-01: 1 mg via INTRAVENOUS

## 2015-10-01 MED ORDER — POVIDONE-IODINE 5 % OP SOLN
OPHTHALMIC | Status: AC
  Filled 2015-10-01: qty 30

## 2015-10-01 MED ORDER — NA CHONDROIT SULF-NA HYALURON 40-17 MG/ML IO SOLN
INTRAOCULAR | Status: DC | PRN
  Administered 2015-10-01: 1 mL via INTRAOCULAR

## 2015-10-01 MED ORDER — CEFUROXIME OPHTHALMIC INJECTION 1 MG/0.1 ML
INJECTION | OPHTHALMIC | Status: DC | PRN
  Administered 2015-10-01: 0.1 mL via INTRACAMERAL

## 2015-10-01 MED ORDER — NA CHONDROIT SULF-NA HYALURON 40-17 MG/ML IO SOLN
INTRAOCULAR | Status: AC
  Filled 2015-10-01: qty 1

## 2015-10-01 MED ORDER — LIDOCAINE HCL (PF) 4 % IJ SOLN
INTRAMUSCULAR | Status: DC | PRN
  Administered 2015-10-01: 09:00:00 via OPHTHALMIC

## 2015-10-01 MED ORDER — MOXIFLOXACIN HCL 0.5 % OP SOLN
1.0000 [drp] | OPHTHALMIC | Status: DC | PRN
  Administered 2015-10-01: 1 [drp] via OPHTHALMIC

## 2015-10-01 MED ORDER — CARBACHOL 0.01 % IO SOLN
INTRAOCULAR | Status: DC | PRN
  Administered 2015-10-01: 0.5 mL via INTRAOCULAR

## 2015-10-01 MED ORDER — BUPIVACAINE HCL (PF) 0.75 % IJ SOLN
INTRAMUSCULAR | Status: AC
  Filled 2015-10-01: qty 10

## 2015-10-01 MED ORDER — PHENYLEPHRINE HCL 10 % OP SOLN
1.0000 [drp] | OPHTHALMIC | Status: DC | PRN
  Administered 2015-10-01: 1 [drp] via OPHTHALMIC

## 2015-10-01 MED ORDER — LIDOCAINE HCL (PF) 4 % IJ SOLN
INTRAMUSCULAR | Status: AC
Start: 2015-10-01 — End: 2015-10-01
  Filled 2015-10-01: qty 5

## 2015-10-01 SURGICAL SUPPLY — 30 items
CANNULA ANT/CHMB 27G (MISCELLANEOUS) ×1 IMPLANT
CANNULA ANT/CHMB 27GA (MISCELLANEOUS) ×2 IMPLANT
CORD BIP STRL DISP 12FT (MISCELLANEOUS) ×2 IMPLANT
CUP MEDICINE 2OZ PLAST GRAD ST (MISCELLANEOUS) ×2 IMPLANT
DRAPE XRAY CASSETTE 23X24 (DRAPES) ×2 IMPLANT
ERASER HMR WETFIELD 18G (MISCELLANEOUS) ×2 IMPLANT
GLOVE BIO SURGEON STRL SZ8 (GLOVE) ×2 IMPLANT
GLOVE SURG LX 6.5 MICRO (GLOVE) ×1
GLOVE SURG LX 8.0 MICRO (GLOVE) ×1
GLOVE SURG LX STRL 6.5 MICRO (GLOVE) ×1 IMPLANT
GLOVE SURG LX STRL 8.0 MICRO (GLOVE) ×1 IMPLANT
GOWN STRL REUS W/ TWL LRG LVL3 (GOWN DISPOSABLE) ×1 IMPLANT
GOWN STRL REUS W/ TWL XL LVL3 (GOWN DISPOSABLE) ×1 IMPLANT
GOWN STRL REUS W/TWL LRG LVL3 (GOWN DISPOSABLE) ×2
GOWN STRL REUS W/TWL XL LVL3 (GOWN DISPOSABLE) ×2
LENS IOL ACRYSOF IQ 20.0 (Intraocular Lens) ×1 IMPLANT
PACK CATARACT (MISCELLANEOUS) ×2 IMPLANT
PACK CATARACT DINGLEDEIN LX (MISCELLANEOUS) ×2 IMPLANT
PACK EYE AFTER SURG (MISCELLANEOUS) ×2 IMPLANT
SHLD EYE VISITEC  UNIV (MISCELLANEOUS) ×2 IMPLANT
SOL BSS BAG (MISCELLANEOUS) ×2
SOL PREP PVP 2OZ (MISCELLANEOUS) ×2
SOLUTION BSS BAG (MISCELLANEOUS) ×1 IMPLANT
SOLUTION PREP PVP 2OZ (MISCELLANEOUS) ×1 IMPLANT
SUT SILK 5-0 (SUTURE) ×2 IMPLANT
SYR 3ML LL SCALE MARK (SYRINGE) ×2 IMPLANT
SYR 5ML LL (SYRINGE) ×2 IMPLANT
SYR TB 1ML 27GX1/2 LL (SYRINGE) ×2 IMPLANT
WATER STERILE IRR 1000ML POUR (IV SOLUTION) ×2 IMPLANT
WIPE NON LINTING 3.25X3.25 (MISCELLANEOUS) ×2 IMPLANT

## 2015-10-01 NOTE — Discharge Instructions (Signed)
Eye Surgery Discharge Instructions  Expect mild scratchy sensation or mild soreness. DO NOT RUB YOUR EYE!  The day of surgery:  Minimal physical activity, but bed rest is not required  No reading, computer work, or close hand work  No bending, lifting, or straining.  May watch TV  For 24 hours:  No driving, legal decisions, or alcoholic beverages  Safety precautions  Eat anything you prefer: It is better to start with liquids, then soup then solid foods.  _____ Eye patch should be worn until postoperative exam tomorrow.  ____ Solar shield eyeglasses should be worn for comfort in the sunlight/patch while sleeping  Resume all regular medications including aspirin or Coumadin if these were discontinued prior to surgery. You may shower, bathe, shave, or wash your hair. Tylenol may be taken for mild discomfort.  Call your doctor if you experience significant pain, nausea, or vomiting, fever > 101 or other signs of infection. [phone] or [phone] Specific instructions:  Follow-up Information    Follow up with Erik Spadafora, MD In 1 day.   Specialty:  Ophthalmology   Why:  June 13 at 8:50am   Contact information:   [address]   [place] [address] [phone]

## 2015-10-01 NOTE — Anesthesia Preprocedure Evaluation (Signed)
Anesthesia Evaluation  Patient identified by MRN, date of birth, ID band Patient awake    Reviewed: Allergy & Precautions, NPO status , Patient's Chart, lab work & pertinent test results  History of Anesthesia Complications Negative for: history of anesthetic complications  Airway Mallampati: III       Dental   Pulmonary neg pulmonary ROS, sleep apnea , former smoker,           Cardiovascular hypertension, Pt. on medications + dysrhythmias Atrial Fibrillation      Neuro/Psych negative neurological ROS     GI/Hepatic negative GI ROS, Neg liver ROS,   Endo/Other  diabetes, Type 2, Oral Hypoglycemic Agents  Renal/GU negative Renal ROS     Musculoskeletal  (+) Arthritis ,   Abdominal   Peds  Hematology negative hematology ROS (+)   Anesthesia Other Findings   Reproductive/Obstetrics                             Anesthesia Physical Anesthesia Plan  ASA: III  Anesthesia Plan: MAC   Post-op Pain Management:    Induction: Intravenous  Airway Management Planned:   Additional Equipment:   Intra-op Plan:   Post-operative Plan:   Informed Consent: I have reviewed the patients History and Physical, chart, labs and discussed the procedure including the risks, benefits and alternatives for the proposed anesthesia with the patient or authorized representative who has indicated his/her understanding and acceptance.     Plan Discussed with:   Anesthesia Plan Comments:         Anesthesia Quick Evaluation

## 2015-10-01 NOTE — Transfer of Care (Signed)
Immediate Anesthesia Transfer of Care Note  Patient: Erik [name]  Procedure(s) Performed: Procedure(s) with comments: CATARACT EXTRACTION PHACO AND INTRAOCULAR LENS PLACEMENT (IOC) (Right) - [place] 51.3 AP% 18.2 CDE 18.28 Fluid pack lot # [id number]  Patient Location: PACU  Anesthesia Type:MAC  Level of Consciousness: awake  Airway & Oxygen Therapy: Patient Spontanous Breathing and Patient connected to nasal cannula oxygen  Post-op Assessment: Report given to RN and Post -op Vital signs reviewed and stable  Post vital signs: Reviewed and stable  Last Vitals:  Filed Vitals:   10/01/15 0756  BP: 137/81  Pulse: 83  Temp: 36.7 C  Resp: 18    Last Pain: There were no vitals filed for this visit.       Complications: No apparent anesthesia complications

## 2015-10-01 NOTE — Interval H&P Note (Signed)
History and Physical Interval Note:  10/01/2015 8:51 AM  Erik [name]  has presented today for surgery, with the diagnosis of NUCLEAR SCLEROTIC CATARACT RIGHT EYE  The various methods of treatment have been discussed with the patient and family. After consideration of risks, benefits and other options for treatment, the patient has consented to  Procedure(s) with comments: CATARACT EXTRACTION PHACO AND INTRAOCULAR LENS PLACEMENT (IOC) (Right) - [place] AP% CDE Fluid pack lot # [id number] as a surgical intervention .  The patient's history has been reviewed, patient examined, no change in status, stable for surgery.  I have reviewed the patient's chart and labs.  Questions were answered to the patient's satisfaction.     Pilar Westergaard

## 2015-10-01 NOTE — Anesthesia Procedure Notes (Signed)
Procedure Name: MAC Date/Time: 10/01/2015 9:02 AM Performed by: Allean Found Pre-anesthesia Checklist: Patient identified, Emergency Drugs available, Suction available, Patient being monitored and Timeout performed Patient Re-evaluated:Patient Re-evaluated prior to inductionOxygen Delivery Method: Nasal cannula

## 2015-10-01 NOTE — OR Nursing (Signed)
IV discontinued from right hand. No complications. No redness or edema.

## 2015-10-01 NOTE — Anesthesia Postprocedure Evaluation (Signed)
Anesthesia Post Note  Patient: Erik [name]  Procedure(s) Performed: Procedure(s) (LRB): CATARACT EXTRACTION PHACO AND INTRAOCULAR LENS PLACEMENT (IOC) (Right)  Patient location during evaluation: PACU Anesthesia Type: MAC Level of consciousness: awake Pain management: pain level controlled Vital Signs Assessment: post-procedure vital signs reviewed and stable Respiratory status: spontaneous breathing Cardiovascular status: stable and blood pressure returned to baseline Postop Assessment: no headache Anesthetic complications: no    Last Vitals:  Filed Vitals:   10/01/15 0756  BP: 137/81  Pulse: 83  Temp: 36.7 C  Resp: 18    Last Pain: There were no vitals filed for this visit.               Buckner Malta

## 2015-10-01 NOTE — Op Note (Signed)
Date of Surgery: 10/01/2015 Date of Dictation: 10/01/2015 9:34 AM Pre-operative Diagnosis:  Nuclear Sclerotic Cataract and Posterior Subcapsular Cataract right Eye Post-operative Diagnosis: same Procedure performed: Extra-capsular Cataract Extraction (ECCE) with placement of a posterior chamber intraocular lens (IOL) right Eye IOL:  Implant Name Type Inv. Item Serial No. Manufacturer Lot No. LRB No. Used  LENS IOL ACRYSOF IQ 20.0 - [id number] Intraocular Lens LENS IOL ACRYSOF IQ 20.0 [id number] ALCON   Right 1   Anesthesia: 2% Lidocaine and 4% Marcaine in a 50/50 mixture with 10 unites/ml of Hylenex given as a peribulbar Anesthesiologist: Anesthesiologist: Gunnar Fusi, MD CRNA: Allean Found, CRNA Complications: none Estimated Blood Loss: less than 1 ml  Description of procedure:  The patient was given anesthesia and sedation via intravenous access. The patient was then prepped and draped in the usual fashion. A 25-gauge needle was bent for initiating the capsulorhexis. A 5-0 silk suture was placed through the conjunctiva superior and inferiorly to serve as bridle sutures. Hemostasis was obtained at the superior limbus using an eraser cautery. A partial thickness groove was made at the anterior surgical limbus with a 64 Beaver blade and this was dissected anteriorly with an [employer]aya. The anterior chamber was entered at 10 o'clock with a 1.0 mm paracentesis knife and through the lamellar dissection with a 2.6 mm Alcon keratome. Epi-Shugarcaine 0.5 CC [9 cc BSS Plus (Alcon), 3 cc 4% preservative-free lidocaine (Hospira) and 4 cc 1:1000 preservative-free, bisulfite-free epinephrine] was injected into the anterior chamber via the paracentesis tract. Epi-Shugarcaine 0.5 CC [9 cc BSS Plus (Alcon), 3 cc 4% preservative-free lidocaine (Hospira) and 4 cc 1:1000 preservative-free, bisulfite-free epinephrine] was injected into the anterior chamber via the paracentesis tract. DiscoVisc was  injected to replace the aqueous and a continuous tear curvilinear capsulorhexis was performed using a bent 25-gauge needle.  Balance salt on a syringe was used to perform hydro-dissection and phacoemulsification was carried out using a divide and conquer technique. Procedure(s) with comments: CATARACT EXTRACTION PHACO AND INTRAOCULAR LENS PLACEMENT (IOC) (Right) - [place] 51.3 AP% 18.2 CDE 18.28 Fluid pack lot # [id number]. Irrigation/aspiration was used to remove the residual cortex and the capsular bag was inflated with DiscoVisc. The intraocular lens was inserted into the capsular bag using a pre-loaded UltraSert Delivery System. Irrigation/aspiration was used to remove the residual DiscoVisc. The wound was inflated with balanced salt and checked for leaks. None were found. Miostat was injected via the paracentesis track and 0.1 ml of cefuroxime containing 1 mg of drug  was injected via the paracentesis track. The wound was checked for leaks again and none were found.   The bridal sutures were removed and two drops of Vigamox were placed on the eye. An eye shield was placed to protect the eye and the patient was discharged to the recovery area in good condition.   Mavryk Pino MD

## 2015-10-09 ENCOUNTER — Encounter: Payer: Self-pay | Admitting: Dietician

## 2016-01-16 ENCOUNTER — Encounter (HOSPITAL_COMMUNITY): Payer: Self-pay

## 2016-02-12 ENCOUNTER — Ambulatory Visit: Payer: Self-pay | Admitting: Licensed Clinical Social Worker

## 2016-03-04 ENCOUNTER — Ambulatory Visit (INDEPENDENT_AMBULATORY_CARE_PROVIDER_SITE_OTHER): Payer: 59 | Admitting: Licensed Clinical Social Worker

## 2016-03-04 DIAGNOSIS — F1221 Cannabis dependence, in remission: Secondary | ICD-10-CM

## 2016-03-04 DIAGNOSIS — F39 Unspecified mood [affective] disorder: Secondary | ICD-10-CM | POA: Diagnosis not present

## 2016-03-04 DIAGNOSIS — F1021 Alcohol dependence, in remission: Secondary | ICD-10-CM

## 2016-03-04 DIAGNOSIS — F1521 Other stimulant dependence, in remission: Secondary | ICD-10-CM | POA: Diagnosis not present
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
## 2016-03-25 ENCOUNTER — Ambulatory Visit (INDEPENDENT_AMBULATORY_CARE_PROVIDER_SITE_OTHER): Payer: 59 | Admitting: Licensed Clinical Social Worker

## 2016-03-25 DIAGNOSIS — F1021 Alcohol dependence, in remission: Secondary | ICD-10-CM | POA: Diagnosis not present

## 2016-03-25 DIAGNOSIS — F39 Unspecified mood [affective] disorder: Secondary | ICD-10-CM

## 2016-03-25 DIAGNOSIS — F1521 Other stimulant dependence, in remission: Secondary | ICD-10-CM

## 2016-03-25 DIAGNOSIS — F1221 Cannabis dependence, in remission: Secondary | ICD-10-CM

## 2016-03-27 NOTE — Progress Notes (Signed)
   THERAPIST PROGRESS NOTE  Session Time: 68min  Participation Level: Active  Behavioral Response: CasualAlertEuthymic  Type of Therapy: Individual Therapy  Treatment Goals addressed: Coping  Interventions: CBT, Motivational Interviewing, Solution Focused, Strength-based, Supportive, Family Systems and Reframing  Summary: Erik [name] is a 55 y.o. male who presents with continued symptoms of his diagnosis.  Patient was able to discuss his apprehension about therapy and why he has second thoughts about attending.  Patient reports that he wants to be "whole." Patient reports that he is currently married and learning how to interact with his wife.  Patient states that he has difficulty with holidays since he ran away from home when he was 61.  Patient reports that he has not had a desire to use substances.  Patient reports that he has to remain busy to refrain from illicit drugs.  Patient discussed his goals and personal development plan.  .   Suicidal/Homicidal: No  Therapist Response: Clinician actively listened as patient discussed his purpose for current therapy.  LCSW discussed what psychotherapy is and is not and the importance of the therapeutic relationship to include open and honest communication between client and therapist and building trust.  Reviewed advantages and disadvantages of the therapeutic process and limitations to the therapeutic relationship including LCSW's role in maintaining the safety of the client, others and those in client's care  Plan: Return again in 2 weeks.  Diagnosis: Axis I: Mood Disorder NOS    Axis II: No diagnosis    Lubertha South, LCSW 03/27/2016

## 2016-04-29 ENCOUNTER — Ambulatory Visit (INDEPENDENT_AMBULATORY_CARE_PROVIDER_SITE_OTHER): Payer: 59 | Admitting: Licensed Clinical Social Worker

## 2016-04-29 DIAGNOSIS — F39 Unspecified mood [affective] disorder: Secondary | ICD-10-CM | POA: Diagnosis not present

## 2016-05-06 NOTE — Progress Notes (Signed)
   THERAPIST PROGRESS NOTE  Session Time:74min  Participation Level: Active  Behavioral Response: NeatAlertEuthymic  Type of Therapy: Individual Therapy  Treatment Goals addressed: Coping  Interventions: CBT, Motivational Interviewing, Solution Focused and Supportive  Summary: Erik [name] is a 56 y.o. male who presents with continued symptoms of his diagnosis.  Patient reports a favorable mood.  He reports that he is beginning to contemplate losing weight.   He reports that he continues to attend NA meetings to assist with remaining sober. Patient reports that he struggles with his relationship with his wife. Patient reports his struggles with his job and his sobriety.  Discussion of how he can maintain a life with addiction. Reports that he feels like he is excessive with anything that he tries such as: healthy eating, finances, etc.   Suicidal/Homicidal: No  Therapist Response: Assessed pt current functioning per pt report.  Explored w/ pt contributing factors to increased favorable mood.  Validated feelings and stressors and focused pt on using skills of reframing and conflict resolution for coping.  Plan: Return again in 2 weeks.  Diagnosis: Axis I: Episodic Mood Disorder    Axis II: No diagnosis    Lubertha South, LCSW 04/30/2016

## 2016-05-27 ENCOUNTER — Ambulatory Visit: Payer: 59 | Admitting: Licensed Clinical Social Worker

## 2016-06-10 ENCOUNTER — Ambulatory Visit (INDEPENDENT_AMBULATORY_CARE_PROVIDER_SITE_OTHER): Payer: 59 | Admitting: Licensed Clinical Social Worker

## 2016-06-10 DIAGNOSIS — F39 Unspecified mood [affective] disorder: Secondary | ICD-10-CM

## 2016-06-17 DIAGNOSIS — R7302 Impaired glucose tolerance (oral): Secondary | ICD-10-CM | POA: Diagnosis not present

## 2016-06-17 DIAGNOSIS — I10 Essential (primary) hypertension: Secondary | ICD-10-CM | POA: Diagnosis not present

## 2016-06-20 DIAGNOSIS — E785 Hyperlipidemia, unspecified: Secondary | ICD-10-CM | POA: Diagnosis not present

## 2016-06-20 DIAGNOSIS — E119 Type 2 diabetes mellitus without complications: Secondary | ICD-10-CM | POA: Diagnosis not present

## 2016-06-20 DIAGNOSIS — I10 Essential (primary) hypertension: Secondary | ICD-10-CM | POA: Diagnosis not present

## 2016-06-20 NOTE — Progress Notes (Signed)
   THERAPIST PROGRESS NOTE  Session Time:83mn  Participation Level: Active  Behavioral Response: NeatAlertEuthymic  Type of Therapy: Individual Therapy  Treatment Goals addressed: Coping  Interventions: CBT, Motivational Interviewing, Solution Focused and Supportive  Summary: Erik Mendiblesis a 56y.o. male who presents with continued symptoms of his diagnosis.  Patient reports a favorable mood.  Therapist met with Patient in an outpatient setting to assess mood and assist with making progress towards goals.  Therapist completed a brief mood check assessing for anger, happiness, sadness, fear, disgust, and excitement.  Therapist provided active listening as Patient discussed news associated with his progress. Patient expressed slight happiness associated with the feedback from his friends. Therapist praised Patient for his ability to continue to produce positive thoughts during fun times with friends. Therapist inquired about Patient's relationship status since he has been chatting with a male friend and not informing his wife.  Therapist praised Patient for his ability to have positive thinking, as initially all of his thoughts were negative and produced no results. Therapist educated Patient on the importance of balanced thoughts.  Therapist, when necessary pointed out to Patient his use of negative thinking styles.  Therapist provided examples to Patient's understanding as to why it is important to be mindful of this type of mental filtering as it often disqualifies the positives that are occurring.  .   Suicidal/Homicidal: No  Therapist Response: Assessed pt current functioning per pt report.  Explored w/ pt contributing factors to increased favorable mood.  Validated feelings and stressors and focused pt on using skills of reframing and conflict resolution for coping.  Plan: Return again in 2 weeks.  Diagnosis: Axis I: Episodic Mood Disorder    Axis II: No  diagnosis    NLubertha South LCSW 06/12/2016

## 2016-07-15 ENCOUNTER — Ambulatory Visit: Payer: 59 | Admitting: Licensed Clinical Social Worker

## 2016-08-12 ENCOUNTER — Ambulatory Visit (INDEPENDENT_AMBULATORY_CARE_PROVIDER_SITE_OTHER): Payer: 59 | Admitting: Licensed Clinical Social Worker

## 2016-08-12 DIAGNOSIS — F1021 Alcohol dependence, in remission: Secondary | ICD-10-CM

## 2016-08-12 DIAGNOSIS — F39 Unspecified mood [affective] disorder: Secondary | ICD-10-CM

## 2016-08-12 DIAGNOSIS — F1521 Other stimulant dependence, in remission: Secondary | ICD-10-CM

## 2016-08-12 DIAGNOSIS — F1221 Cannabis dependence, in remission: Secondary | ICD-10-CM

## 2016-08-21 NOTE — Progress Notes (Signed)
   THERAPIST PROGRESS NOTE  Session Time:24min  Participation Level: Active  Behavioral Response: NeatAlertEuthymic  Type of Therapy: Individual Therapy  Treatment Goals addressed: Coping  Interventions: CBT, Motivational Interviewing, Solution Focused and Supportive  Summary: Bryant Saye is a 56 y.o. male who presents with continued symptoms of his diagnosis.  Patient reports a favorable mood.  Therapist provided active listening as Patient shared details about herself, his current mood and stressors.  Therapist provided support and feedback for Patient as he expressed concern about his level of frustration with his wife and his feelings toward their marriage.  Therapist listened and challenged Patient to explore alternate ways of explaining his point without engaging in negative verbal expressions.  Therapist assisted Patient and provided additional support and feedback to address Patient's concerns regarding his relationship status with wife and male friends.  Therapist shifted focus of discussion to managing current symptoms, avoiding triggers improving problem solving skills.  Therapist praised Patient for his attempts to problem solve regarding his relationships concern. Therapist addressed Patient's concerns about his inability to communicate to his wife about what he is not receiving from her and how it relates to his diagnosis.  Suicidal/Homicidal: No  Therapist Response: Assessed pt current functioning per pt report.  Explored w/ pt contributing factors to increased favorable mood.  Validated feelings and stressors and focused pt on using skills of reframing and conflict resolution for coping.  Plan: Return again in 2 weeks.  Diagnosis: Axis I: Episodic Mood Disorder    Axis II: No diagnosis    Lubertha South, LCSW 08/13/2016

## 2016-09-02 DIAGNOSIS — M25851 Other specified joint disorders, right hip: Secondary | ICD-10-CM | POA: Diagnosis not present

## 2016-09-02 DIAGNOSIS — M1711 Unilateral primary osteoarthritis, right knee: Secondary | ICD-10-CM | POA: Diagnosis not present

## 2016-09-02 DIAGNOSIS — M25852 Other specified joint disorders, left hip: Secondary | ICD-10-CM | POA: Diagnosis not present

## 2016-10-21 DIAGNOSIS — E113313 Type 2 diabetes mellitus with moderate nonproliferative diabetic retinopathy with macular edema, bilateral: Secondary | ICD-10-CM | POA: Diagnosis not present

## 2016-11-25 DIAGNOSIS — E113313 Type 2 diabetes mellitus with moderate nonproliferative diabetic retinopathy with macular edema, bilateral: Secondary | ICD-10-CM | POA: Diagnosis not present

## 2016-12-25 DIAGNOSIS — Z23 Encounter for immunization: Secondary | ICD-10-CM | POA: Diagnosis not present

## 2016-12-26 DIAGNOSIS — E11311 Type 2 diabetes mellitus with unspecified diabetic retinopathy with macular edema: Secondary | ICD-10-CM | POA: Diagnosis not present

## 2016-12-30 DIAGNOSIS — E113313 Type 2 diabetes mellitus with moderate nonproliferative diabetic retinopathy with macular edema, bilateral: Secondary | ICD-10-CM | POA: Diagnosis not present

## 2017-09-15 DIAGNOSIS — I10 Essential (primary) hypertension: Secondary | ICD-10-CM | POA: Diagnosis not present

## 2017-09-15 DIAGNOSIS — Z125 Encounter for screening for malignant neoplasm of prostate: Secondary | ICD-10-CM | POA: Diagnosis not present

## 2017-09-18 DIAGNOSIS — I10 Essential (primary) hypertension: Secondary | ICD-10-CM | POA: Diagnosis not present

## 2017-09-18 DIAGNOSIS — E119 Type 2 diabetes mellitus without complications: Secondary | ICD-10-CM | POA: Diagnosis not present

## 2017-09-18 DIAGNOSIS — E785 Hyperlipidemia, unspecified: Secondary | ICD-10-CM | POA: Diagnosis not present

## 2017-11-24 DIAGNOSIS — E113313 Type 2 diabetes mellitus with moderate nonproliferative diabetic retinopathy with macular edema, bilateral: Secondary | ICD-10-CM | POA: Diagnosis not present

## 2017-12-08 DIAGNOSIS — R809 Proteinuria, unspecified: Secondary | ICD-10-CM | POA: Diagnosis not present

## 2017-12-08 DIAGNOSIS — I10 Essential (primary) hypertension: Secondary | ICD-10-CM | POA: Diagnosis not present

## 2017-12-08 DIAGNOSIS — R600 Localized edema: Secondary | ICD-10-CM | POA: Diagnosis not present

## 2017-12-11 ENCOUNTER — Other Ambulatory Visit: Payer: Self-pay | Admitting: Nephrology

## 2017-12-11 DIAGNOSIS — R809 Proteinuria, unspecified: Secondary | ICD-10-CM

## 2017-12-17 ENCOUNTER — Ambulatory Visit: Payer: 59

## 2017-12-18 DIAGNOSIS — Z23 Encounter for immunization: Secondary | ICD-10-CM | POA: Diagnosis not present

## 2018-01-05 ENCOUNTER — Ambulatory Visit
Admission: RE | Admit: 2018-01-05 | Discharge: 2018-01-05 | Disposition: A | Discharge status: Home / Self Care | Payer: 59 | Source: Ambulatory Visit | Attending: Nephrology | Admitting: Nephrology

## 2018-01-05 DIAGNOSIS — R809 Proteinuria, unspecified: Secondary | ICD-10-CM | POA: Diagnosis not present

## 2018-01-26 DIAGNOSIS — Z125 Encounter for screening for malignant neoplasm of prostate: Secondary | ICD-10-CM | POA: Diagnosis not present

## 2018-01-26 DIAGNOSIS — E119 Type 2 diabetes mellitus without complications: Secondary | ICD-10-CM | POA: Diagnosis not present

## 2018-02-01 DIAGNOSIS — E785 Hyperlipidemia, unspecified: Secondary | ICD-10-CM | POA: Diagnosis not present

## 2018-02-01 DIAGNOSIS — E119 Type 2 diabetes mellitus without complications: Secondary | ICD-10-CM | POA: Diagnosis not present

## 2018-02-01 DIAGNOSIS — I10 Essential (primary) hypertension: Secondary | ICD-10-CM | POA: Diagnosis not present

## 2018-02-18 ENCOUNTER — Emergency Department: Payer: 59

## 2018-02-18 ENCOUNTER — Other Ambulatory Visit: Payer: Self-pay

## 2018-02-18 ENCOUNTER — Inpatient Hospital Stay
Admission: EM | Admit: 2018-02-18 | Discharge: 2018-02-20 | DRG: 065 | Disposition: A | Discharge status: Home / Self Care | Payer: 59 | Attending: Internal Medicine | Admitting: Internal Medicine

## 2018-02-18 DIAGNOSIS — Z9841 Cataract extraction status, right eye: Secondary | ICD-10-CM

## 2018-02-18 DIAGNOSIS — R29701 NIHSS score 1: Secondary | ICD-10-CM | POA: Diagnosis present

## 2018-02-18 DIAGNOSIS — M1990 Unspecified osteoarthritis, unspecified site: Secondary | ICD-10-CM | POA: Diagnosis present

## 2018-02-18 DIAGNOSIS — I6340 Cerebral infarction due to embolism of unspecified cerebral artery: Secondary | ICD-10-CM | POA: Diagnosis not present

## 2018-02-18 DIAGNOSIS — E119 Type 2 diabetes mellitus without complications: Secondary | ICD-10-CM | POA: Diagnosis not present

## 2018-02-18 DIAGNOSIS — Z8349 Family history of other endocrine, nutritional and metabolic diseases: Secondary | ICD-10-CM

## 2018-02-18 DIAGNOSIS — I669 Occlusion and stenosis of unspecified cerebral artery: Secondary | ICD-10-CM | POA: Diagnosis not present

## 2018-02-18 DIAGNOSIS — I10 Essential (primary) hypertension: Secondary | ICD-10-CM | POA: Diagnosis present

## 2018-02-18 DIAGNOSIS — Z7982 Long term (current) use of aspirin: Secondary | ICD-10-CM

## 2018-02-18 DIAGNOSIS — Z961 Presence of intraocular lens: Secondary | ICD-10-CM | POA: Diagnosis present

## 2018-02-18 DIAGNOSIS — Z7984 Long term (current) use of oral hypoglycemic drugs: Secondary | ICD-10-CM | POA: Diagnosis not present

## 2018-02-18 DIAGNOSIS — I63239 Cerebral infarction due to unspecified occlusion or stenosis of unspecified carotid arteries: Secondary | ICD-10-CM | POA: Diagnosis not present

## 2018-02-18 DIAGNOSIS — H5340 Unspecified visual field defects: Secondary | ICD-10-CM | POA: Diagnosis present

## 2018-02-18 DIAGNOSIS — I482 Chronic atrial fibrillation, unspecified: Secondary | ICD-10-CM | POA: Diagnosis present

## 2018-02-18 DIAGNOSIS — G4733 Obstructive sleep apnea (adult) (pediatric): Secondary | ICD-10-CM | POA: Diagnosis present

## 2018-02-18 DIAGNOSIS — E1136 Type 2 diabetes mellitus with diabetic cataract: Secondary | ICD-10-CM | POA: Diagnosis present

## 2018-02-18 DIAGNOSIS — E785 Hyperlipidemia, unspecified: Secondary | ICD-10-CM | POA: Diagnosis present

## 2018-02-18 DIAGNOSIS — Z833 Family history of diabetes mellitus: Secondary | ICD-10-CM

## 2018-02-18 DIAGNOSIS — I4891 Unspecified atrial fibrillation: Secondary | ICD-10-CM | POA: Diagnosis not present

## 2018-02-18 DIAGNOSIS — R51 Headache: Secondary | ICD-10-CM | POA: Diagnosis not present

## 2018-02-18 DIAGNOSIS — I639 Cerebral infarction, unspecified: Secondary | ICD-10-CM | POA: Diagnosis not present

## 2018-02-18 DIAGNOSIS — Z9842 Cataract extraction status, left eye: Secondary | ICD-10-CM

## 2018-02-18 DIAGNOSIS — Z87891 Personal history of nicotine dependence: Secondary | ICD-10-CM

## 2018-02-18 DIAGNOSIS — Z8249 Family history of ischemic heart disease and other diseases of the circulatory system: Secondary | ICD-10-CM

## 2018-02-18 DIAGNOSIS — I6389 Other cerebral infarction: Secondary | ICD-10-CM | POA: Diagnosis not present

## 2018-02-18 LAB — URINALYSIS, ROUTINE W REFLEX MICROSCOPIC
Bilirubin Urine: NEGATIVE
GLUCOSE, UA: 50 mg/dL — AB
Hgb urine dipstick: NEGATIVE
Ketones, ur: NEGATIVE mg/dL
Leukocytes, UA: NEGATIVE
NITRITE: NEGATIVE
PH: 7 (ref 5.0–8.0)
Protein, ur: 100 mg/dL — AB
SPECIFIC GRAVITY, URINE: 1.006 (ref 1.005–1.030)
SQUAMOUS EPITHELIAL / LPF: NONE SEEN (ref 0–5)

## 2018-02-18 LAB — CBC WITH DIFFERENTIAL/PLATELET
ABS IMMATURE GRANULOCYTES: 0.04 10*3/uL (ref 0.00–0.07)
BASOS PCT: 1 %
Basophils Absolute: 0.1 10*3/uL (ref 0.0–0.1)
Eosinophils Absolute: 0.5 10*3/uL (ref 0.0–0.5)
Eosinophils Relative: 5 %
HCT: 37.8 % — ABNORMAL LOW (ref 39.0–52.0)
Hemoglobin: 12.8 g/dL — ABNORMAL LOW (ref 13.0–17.0)
IMMATURE GRANULOCYTES: 0 %
Lymphocytes Relative: 23 %
Lymphs Abs: 2.3 10*3/uL (ref 0.7–4.0)
MCH: 31.9 pg (ref 26.0–34.0)
MCHC: 33.9 g/dL (ref 30.0–36.0)
MCV: 94.3 fL (ref 80.0–100.0)
MONOS PCT: 9 %
Monocytes Absolute: 0.9 10*3/uL (ref 0.1–1.0)
Neutro Abs: 6 10*3/uL (ref 1.7–7.7)
Neutrophils Relative %: 62 %
PLATELETS: 210 10*3/uL (ref 150–400)
RBC: 4.01 MIL/uL — AB (ref 4.22–5.81)
RDW: 13.1 % (ref 11.5–15.5)
WBC: 9.8 10*3/uL (ref 4.0–10.5)
nRBC: 0 % (ref 0.0–0.2)

## 2018-02-18 LAB — BASIC METABOLIC PANEL
ANION GAP: 10 (ref 5–15)
BUN: 18 mg/dL (ref 6–20)
CO2: 27 mmol/L (ref 22–32)
Calcium: 8.9 mg/dL (ref 8.9–10.3)
Chloride: 102 mmol/L (ref 98–111)
Creatinine, Ser: 1.15 mg/dL (ref 0.61–1.24)
GFR calc Af Amer: >60 mL/min (ref >60)
Glucose, Bld: 122 mg/dL — ABNORMAL HIGH (ref 70–99)
POTASSIUM: 3.4 mmol/L — AB (ref 3.5–5.1)
SODIUM: 139 mmol/L (ref 135–145)

## 2018-02-18 LAB — URINE DRUG SCREEN, QUALITATIVE (ARMC ONLY)
AMPHETAMINES, UR SCREEN: NOT DETECTED
BARBITURATES, UR SCREEN: NOT DETECTED
BENZODIAZEPINE, UR SCRN: NOT DETECTED
CANNABINOID 50 NG, UR ~~LOC~~: NOT DETECTED
Cocaine Metabolite,Ur ~~LOC~~: NOT DETECTED
MDMA (ECSTASY) UR SCREEN: NOT DETECTED
Methadone Scn, Ur: NOT DETECTED
Opiate, Ur Screen: NOT DETECTED
Phencyclidine (PCP) Ur S: NOT DETECTED
TRICYCLIC, UR SCREEN: NOT DETECTED

## 2018-02-18 MED ORDER — ASPIRIN 81 MG PO CHEW
324.0000 mg | CHEWABLE_TABLET | Freq: Once | ORAL | Status: AC
  Administered 2018-02-18: 324 mg via ORAL
  Filled 2018-02-18: qty 4

## 2018-02-18 NOTE — ED Triage Notes (Signed)
Pt c/o HA at forehead and unable to see R peripheral vision. Began today at work. Drove home after work. Speech clear. Moving all extremities. No distress noted.

## 2018-02-18 NOTE — ED Provider Notes (Signed)
[facility] Emergency Department Provider Note   ____________________________________________   I have reviewed the triage vital signs and the nursing notes.   HISTORY  Chief Complaint Headache Vision change  History limited by: Not Limited   HPI Erik [name] is a 57 y.o. male who presents to the emergency department today because of concerns for headache and vision change.  He states that he has had a slight headache throughout the day.  He was coming home from work and was stopped at a gas station.  He states that at that time his headache became more severe he also noticed some vision change.  He states that he has decreased right sided peripheral vision.  He states that headache is severe.  Patient denies any numbness or weakness in his extremities.  States he does have a history of A. fib and only takes aspirin for anticoagulation.  Per medical record review patient has a history of DM, afib  Past Medical History:  Diagnosis Date  . Arthritis   . Diabetes mellitus without complication ([place])   . Dysrhythmia    a-fib  . Edema    FEET/LEGS  . Edema    FEET/LEGS  . History of orthopnea   . Hypertension   . Orthopnea   . Palpitations   . Palpitations   . Sleep apnea     There are no active problems to display for this patient.   Past Surgical History:  Procedure Laterality Date  . CATARACT EXTRACTION W/PHACO Left 09/10/2015   Procedure: CATARACT EXTRACTION PHACO AND INTRAOCULAR LENS PLACEMENT (IOC);  Surgeon: Estill Cotta, MD;  Location: [facility] ORS;  Service: Ophthalmology;  Laterality: Left;  [place] 00:51 AP% 15.8 CDE 16.04 fluid pack lot # [id number]  . CATARACT EXTRACTION W/PHACO Right 10/01/2015   Procedure: CATARACT EXTRACTION PHACO AND INTRAOCULAR LENS PLACEMENT ([place]);  Surgeon: Estill Cotta, MD;  Location: [facility] ORS;  Service: Ophthalmology;  Laterality: Right;  [place] 51.3 AP% 18.2 CDE 18.28 Fluid pack lot # [id number]  .  COLONOSCOPY WITH PROPOFOL N/A 10/26/2014   Procedure: COLONOSCOPY WITH PROPOFOL;  Surgeon: Josefine Class, MD;  Location: [facility] ENDOSCOPY;  Service: Endoscopy;  Laterality: N/A;  . HERNIA REPAIR      Prior to Admission medications   Medication Sig Start Date End Date Taking? Authorizing Provider  amLODipine (NORVASC) 10 MG tablet Take 10 mg by mouth daily.    [provider]  glucosamine-chondroitin 500-400 MG tablet Take 1 tablet by mouth 3 (three) times daily.    [provider]  lisinopril-hydrochlorothiazide (PRINZIDE,ZESTORETIC) 20-12.5 MG per tablet Take 1 tablet by mouth daily.    [provider]  metFORMIN (GLUCOPHAGE) 500 MG tablet Take 1,000 mg by mouth 2 (two) times daily with a meal.     [provider]  metoprolol (LOPRESSOR) 100 MG tablet Take 50 mg by mouth 2 (two) times daily.     [provider]  Multiple Vitamins-Minerals (MULTIVITAMIN WITH MINERALS) tablet Take 1 tablet by mouth daily.    [provider]  simvastatin (ZOCOR) 20 MG tablet Take 20 mg by mouth daily.    [provider]    Allergies Patient has no known allergies.  History reviewed. No pertinent family history.  Social History Social History   Tobacco Use  . Smoking status: Former Smoker  Substance Use Topics  . Alcohol use: No    Alcohol/week: 0.0 standard drinks    Comment: Reports has abstained from alcohol for past 15 months.  Marland Kitchen  Drug use: No    Comment: Reports has been drug free for 15 months.    Review of Systems Constitutional: No fever/chills Eyes: Positive for right sided peripheral vision ENT: No sore throat. Cardiovascular: Denies chest pain. Respiratory: Denies shortness of breath. Gastrointestinal: No abdominal pain.  No nausea, no vomiting.  No diarrhea.   Genitourinary: Negative for dysuria. Musculoskeletal: Negative for back pain. Skin: Negative for rash. Neurological: Positive for  headache  ____________________________________________   PHYSICAL EXAM:  VITAL SIGNS: ED Triage Vitals  Enc Vitals Group     BP 02/18/18 1916 (!) 168/93     Pulse Rate 02/18/18 1916 78     Resp 02/18/18 1916 18     Temp 02/18/18 1916 98.8 F (37.1 C)     Temp Source 02/18/18 1916 Oral     SpO2 02/18/18 1916 93 %     Weight 02/18/18 1915 (!) 375 lb (170.1 kg)     Height 02/18/18 1915 6\' 2"  (1.88 m)     Head Circumference --      Peak Flow --      Pain Score 02/18/18 1915 3    Constitutional: Alert and oriented.  Eyes: Conjunctivae are normal.  ENT      Head: Normocephalic and atraumatic.      Nose: No congestion/rhinnorhea.      Mouth/Throat: Mucous membranes are moist.      Neck: No stridor. Hematological/Lymphatic/Immunilogical: No cervical lymphadenopathy. Cardiovascular: Normal rate, regular rhythm.  No murmurs, rubs, or gallops. Respiratory: Normal respiratory effort without tachypnea nor retractions. Breath sounds are clear and equal bilaterally. No wheezes/rales/rhonchi. Gastrointestinal: Soft and non tender. No rebound. No guarding.  Genitourinary: Deferred Musculoskeletal: Normal range of motion in all extremities. No lower extremity edema. Neurologic:  Normal speech and language. No gross focal neurologic deficits are appreciated.  Skin:  Skin is warm, dry and intact. No rash noted. Psychiatric: Mood and affect are normal. Speech and behavior are normal. Patient exhibits appropriate insight and judgment.  ____________________________________________    LABS (pertinent positives/negatives)  BMP na 139, k 3.4, glu 122, cr 1.15 CBC wbc 9.8, hgb 12.8, plt 210  ____________________________________________   EKG  I, Nance Pear, attending physician, personally viewed and interpreted this EKG  EKG Time: 2147 Rate: 61 Rhythm: atrial fibrillation Axis: left axis deviation Intervals: qtc 447 QRS: narrow  ST changes: no st elevation Impression:  abnormal ekg   ____________________________________________    RADIOLOGY  CT head Acute vs subacute infarct in the cerebellum.    ____________________________________________   PROCEDURES  Procedures  ____________________________________________   INITIAL IMPRESSION / ASSESSMENT AND PLAN / ED COURSE  Pertinent labs & imaging results that were available during my care of the patient were reviewed by me and considered in my medical decision making (see chart for details).   Patient presented to the emergency department today because of concerns for headache and right peripheral vision loss.  Patient's head CT does show acute versus subacute left occipital infarct.  This could explain the patient's symptoms.  Patient was evaluated by neurology.  At this point patient is not a candidate for TPA.  Discussed findings with patient.  Will plan on admission.  ____________________________________________   FINAL CLINICAL IMPRESSION(S) / ED DIAGNOSES  Final diagnoses:  Cerebrovascular accident (CVA), unspecified mechanism ([place])  Visual field defect     Note: This dictation was prepared with Sales executive. Any transcriptional errors that result from this process are unintentional     Nance Pear, MD 02/18/18  2326  

## 2018-02-18 NOTE — H&P (Signed)
[place] [facility] [place] NAME: Erik [name]    MR#:  [id number]  DATE OF BIRTH:  1960-11-04  DATE OF ADMISSION:  02/18/2018  PRIMARY CARE PHYSICIAN: Juluis Pitch, MD   REQUESTING/REFERRING PHYSICIAN: Archie Balboa, MD  CHIEF COMPLAINT:   Chief Complaint  Patient presents with  . Headache    HISTORY OF PRESENT ILLNESS:  Erik [name]  is a 57 y.o. male who presents with chief complaint as above.  Patient presents to the ED tonight with acute onset visual disturbance.  He states that he had sudden onset of blurred vision and was "seeing white spots" and then his right visual field went completely black.  CT imaging here in the ED tonight shows occipital lobe stroke.  Hospitalist called for admission  PAST MEDICAL HISTORY:   Past Medical History:  Diagnosis Date  . Arthritis   . Diabetes mellitus without complication ([place])   . Dysrhythmia    a-fib  . Edema    FEET/LEGS  . Edema    FEET/LEGS  . History of orthopnea   . Hypertension   . Orthopnea   . Palpitations   . Palpitations   . Sleep apnea      PAST SURGICAL HISTORY:   Past Surgical History:  Procedure Laterality Date  . CATARACT EXTRACTION W/PHACO Left 09/10/2015   Procedure: CATARACT EXTRACTION PHACO AND INTRAOCULAR LENS PLACEMENT (IOC);  Surgeon: Estill Cotta, MD;  Location: [facility] ORS;  Service: Ophthalmology;  Laterality: Left;  [place] 00:51 AP% 15.8 CDE 16.04 fluid pack lot # [id number] H  . CATARACT EXTRACTION W/PHACO Right 10/01/2015   Procedure: CATARACT EXTRACTION PHACO AND INTRAOCULAR LENS PLACEMENT ([place]);  Surgeon: Estill Cotta, MD;  Location: [facility] ORS;  Service: Ophthalmology;  Laterality: Right;  [place] 51.3 AP% 18.2 CDE 18.28 Fluid pack lot # [id number]  . COLONOSCOPY WITH PROPOFOL N/A 10/26/2014   Procedure: COLONOSCOPY WITH PROPOFOL;  Surgeon: Josefine Class, MD;  Location: [facility] ENDOSCOPY;  Service: Endoscopy;  Laterality: N/A;  . HERNIA  REPAIR       SOCIAL HISTORY:   Social History   Tobacco Use  . Smoking status: Former Smoker  Substance Use Topics  . Alcohol use: No    Alcohol/week: 0.0 standard drinks    Comment: Reports has abstained from alcohol for past 15 months.     FAMILY HISTORY:   Family History  Problem Relation Age of Onset  . Hypertension Mother   . Diabetes Mother   . Hyperlipidemia Mother   . Heart attack Mother      DRUG ALLERGIES:  No Known Allergies  MEDICATIONS AT HOME:   Prior to Admission medications   Medication Sig Start Date End Date Taking? Authorizing Provider  atorvastatin (LIPITOR) 40 MG tablet Take 40 mg by mouth daily. 02/11/18  Yes [provider]  glimepiride (AMARYL) 4 MG tablet Take 4 mg by mouth daily. 02/11/18  Yes [provider]  TRULICITY 2.37 SE/8.3TD SOPN Inject 0.75 mg into the skin once a week. 01/14/18  Yes [provider]  amLODipine (NORVASC) 10 MG tablet Take 10 mg by mouth daily.    [provider]  glucosamine-chondroitin 500-400 MG tablet Take 1 tablet by mouth 3 (three) times daily.    [provider]  lisinopril-hydrochlorothiazide (PRINZIDE,ZESTORETIC) 20-12.5 MG per tablet Take 1 tablet by mouth daily.    [provider]  metFORMIN (GLUCOPHAGE) 500 MG tablet Take 1,000 mg by mouth 2 (two) times daily with a meal.  [provider]  metoprolol (LOPRESSOR) 100 MG tablet Take 50 mg by mouth 2 (two) times daily.     [provider]  Multiple Vitamins-Minerals (MULTIVITAMIN WITH MINERALS) tablet Take 1 tablet by mouth daily.    [provider]  simvastatin (ZOCOR) 20 MG tablet Take 20 mg by mouth daily.    [provider]    REVIEW OF SYSTEMS:  Review of Systems  Constitutional: Negative for chills, fever, malaise/fatigue and weight loss.  HENT: Negative for ear pain, hearing loss and tinnitus.   Eyes: Negative for blurred vision, double vision, pain and  redness.       Right field cut  Respiratory: Negative for cough, hemoptysis and shortness of breath.   Cardiovascular: Negative for chest pain, palpitations, orthopnea and leg swelling.  Gastrointestinal: Negative for abdominal pain, constipation, diarrhea, nausea and vomiting.  Genitourinary: Negative for dysuria, frequency and hematuria.  Musculoskeletal: Negative for back pain, joint pain and neck pain.  Skin:       No acne, rash, or lesions  Neurological: Negative for dizziness, tremors, focal weakness and weakness.  Endo/Heme/Allergies: Negative for polydipsia. Does not bruise/bleed easily.  Psychiatric/Behavioral: Negative for depression. The patient is not nervous/anxious and does not have insomnia.      VITAL SIGNS:   Vitals:   02/18/18 1915 02/18/18 1916 02/18/18 2152  BP:  (!) 168/93 (!) 160/103  Pulse:  78 73  Resp:  18 18  Temp:  98.8 F (37.1 C)   TempSrc:  Oral   SpO2:  93% 93%  Weight: (!) 170.1 kg    Height: 6\' 2"  (1.88 m)     Wt Readings from Last 3 Encounters:  02/18/18 (!) 170.1 kg  09/26/15 (!) 145.2 kg  09/10/15 (!) 145.2 kg    PHYSICAL EXAMINATION:  Physical Exam  Vitals reviewed. Constitutional: He is oriented to person, place, and time. He appears well-developed and well-nourished. No distress.  HENT:  Head: Normocephalic and atraumatic.  Mouth/Throat: Oropharynx is clear and moist.  Eyes: Pupils are equal, round, and reactive to light. Conjunctivae and EOM are normal. No scleral icterus.  Neck: Normal range of motion. Neck supple. No JVD present. No thyromegaly present.  Cardiovascular: Normal rate, regular rhythm and intact distal pulses. Exam reveals no gallop and no friction rub.  No murmur heard. Respiratory: Effort normal and breath sounds normal. No respiratory distress. He has no wheezes. He has no rales.  GI: Soft. Bowel sounds are normal. He exhibits no distension. There is no tenderness.  Musculoskeletal: Normal range of motion. He  exhibits no edema.  No arthritis, no gout  Lymphadenopathy:    He has no cervical adenopathy.  Neurological: He is alert and oriented to person, place, and time. No cranial nerve deficit.  Neurologic: Patient has right visual field cut, cranial nerves II-XII intact, Sensation intact to light touch/pinprick, 5/5 strength in all extremities, no dysarthria, no aphasia, no dysphagia, memory intact  Skin: Skin is warm and dry. No rash noted. No erythema.  Psychiatric: He has a normal mood and affect. His behavior is normal. Judgment and thought content normal.    LABORATORY PANEL:   CBC Recent Labs  Lab 02/18/18 2158  WBC 9.8  HGB 12.8*  HCT 37.8*  PLT 210   ------------------------------------------------------------------------------------------------------------------  Chemistries  Recent Labs  Lab 02/18/18 2158  NA 139  K 3.4*  CL 102  CO2 27  GLUCOSE 122*  BUN 18  CREATININE 1.15  CALCIUM 8.9   ------------------------------------------------------------------------------------------------------------------  Cardiac Enzymes No results for input(s): TROPONINI in the last 168 hours. ------------------------------------------------------------------------------------------------------------------  RADIOLOGY:  Ct Head Wo Contrast  Result Date: 02/18/2018 CLINICAL DATA:  Headache.  Loss peripheral vision in right eye. EXAM: CT HEAD WITHOUT CONTRAST TECHNIQUE: Contiguous axial images were obtained from the base of the skull through the vertex without intravenous contrast. COMPARISON:  None. FINDINGS: Brain: Area of low attenuation peripherally in the right occipital lobe could be an acute or subacute infarct and could certainly affect a visual field. No findings for hemispheric infarction or intracranial hemorrhage. No mass lesions are identified. The brainstem and cerebellum are grossly normal. Vascular: No hyperdense vessel or unexpected calcification. Skull: No skull  fracture or bone lesion. Sinuses/Orbits: Ethmoid and maxillary sinus disease. The mastoid air cells and middle ear cavities are clear. The globes are intact. Other: No scalp lesions or hematoma. There is a small soft tissue mass in the right nasal area measures 16 x 11 mm. Recommend clinical correlation. IMPRESSION: 1. Right occipital infarct could be acute or subacute. MRI suggested for further evaluation. This location could certainly cause a visual field cut. 2. No intracranial hemorrhage or mass lesion. 3. Ethmoid and maxillary sinus disease. 4. Subcutaneous mass in the right nasal area. Electronically Signed   By: Marijo Sanes M.D.   On: 02/18/2018 19:47    EKG:   Orders placed or performed during the hospital encounter of 02/18/18  . ED EKG  . ED EKG    IMPRESSION AND PLAN:  Principal Problem:   Stroke ([place]) -occipital lobe stroke seen on CT.  We will admit per stroke admission order set with appropriate subsequent imaging, labs, consults. Active Problems:   HTN (hypertension) -permissive hypertension tonight, blood pressure goal less than 220/120, hold home antihypertensives for tonight   Diabetes (HCC) -sliding scale insulin with corresponding glucose checks  Chart review performed and case discussed with ED provider. Labs, imaging and/or ECG reviewed by provider and discussed with patient/family. Management plans discussed with the patient and/or family.  DVT PROPHYLAXIS: SubQ lovenox   GI PROPHYLAXIS:  None  ADMISSION STATUS: Inpatient     CODE STATUS: Full Advance Directive Documentation     Most Recent Value  Type of Advance Directive  Healthcare Power of Attorney, Living will  Pre-existing out of facility DNR order (yellow form or pink MOST form)  -  "MOST" Form in Place?  -      TOTAL TIME TAKING CARE OF THIS PATIENT: 45 minutes.   Jannifer Franklin, [place]so FIELDING 02/18/2018, 11:52 PM  [facility]  Office  [phone]  CC: Primary care physician;  Juluis Pitch, MD  Note:  This document was prepared using Dragon voice recognition software and may include unintentional dictation errors.

## 2018-02-18 NOTE — ED Notes (Signed)
Verbal order from Dr. Archie Balboa for CT head, no other orders at this time.

## 2018-02-19 ENCOUNTER — Encounter: Payer: Self-pay | Admitting: Radiology

## 2018-02-19 ENCOUNTER — Inpatient Hospital Stay: Payer: 59

## 2018-02-19 ENCOUNTER — Other Ambulatory Visit: Payer: Self-pay

## 2018-02-19 ENCOUNTER — Inpatient Hospital Stay (HOSPITAL_COMMUNITY)
Admit: 2018-02-19 | Discharge: 2018-02-19 | Disposition: A | Discharge status: Home / Self Care | Payer: 59 | Attending: Nurse Practitioner | Admitting: Nurse Practitioner

## 2018-02-19 DIAGNOSIS — I669 Occlusion and stenosis of unspecified cerebral artery: Secondary | ICD-10-CM

## 2018-02-19 DIAGNOSIS — I639 Cerebral infarction, unspecified: Secondary | ICD-10-CM

## 2018-02-19 LAB — HEMOGLOBIN A1C
Hgb A1c MFr Bld: 6.7 % — ABNORMAL HIGH (ref 4.8–5.6)
Mean Plasma Glucose: 145.59 mg/dL

## 2018-02-19 LAB — GLUCOSE, CAPILLARY
GLUCOSE-CAPILLARY: 121 mg/dL — AB (ref 70–99)
GLUCOSE-CAPILLARY: 265 mg/dL — AB (ref 70–99)
GLUCOSE-CAPILLARY: 155 mg/dL — AB (ref 70–99)
Glucose-Capillary: 145 mg/dL — ABNORMAL HIGH (ref 70–99)
Glucose-Capillary: 193 mg/dL — ABNORMAL HIGH (ref 70–99)
Glucose-Capillary: 149 mg/dL — ABNORMAL HIGH (ref 70–99)

## 2018-02-19 LAB — LIPID PANEL
Cholesterol: 157 mg/dL (ref 0–200)
HDL: 40 mg/dL — AB (ref >40)
LDL CALC: 87 mg/dL (ref 0–99)
Total CHOL/HDL Ratio: 3.9 RATIO
Triglycerides: 152 mg/dL — ABNORMAL HIGH (ref <150)
VLDL: 30 mg/dL (ref 0–40)

## 2018-02-19 LAB — PROTIME-INR
INR: 1.05
Prothrombin Time: 13.6 seconds (ref 11.4–15.2)

## 2018-02-19 LAB — APTT: aPTT: 33 seconds (ref 24–36)

## 2018-02-19 LAB — ECHOCARDIOGRAM COMPLETE
Height: 74 in
Weight: 6004.8 oz

## 2018-02-19 LAB — ETHANOL: Alcohol, Ethyl (B): <10 mg/dL (ref <10)

## 2018-02-19 LAB — TROPONIN I: Troponin I: <0.03 ng/mL (ref <0.03)

## 2018-02-19 MED ORDER — IOPAMIDOL (ISOVUE-370) INJECTION 76%
75.0000 mL | Freq: Once | INTRAVENOUS | Status: AC | PRN
  Administered 2018-02-19: 75 mL via INTRAVENOUS

## 2018-02-19 MED ORDER — METOPROLOL TARTRATE 50 MG PO TABS
50.0000 mg | ORAL_TABLET | Freq: Two times a day (BID) | ORAL | Status: DC
  Administered 2018-02-19 – 2018-02-20 (×3): 50 mg via ORAL
  Filled 2018-02-19 (×3): qty 1

## 2018-02-19 MED ORDER — CLOPIDOGREL BISULFATE 75 MG PO TABS
75.0000 mg | ORAL_TABLET | Freq: Every day | ORAL | Status: DC
  Administered 2018-02-19 – 2018-02-20 (×2): 75 mg via ORAL
  Filled 2018-02-19 (×2): qty 1

## 2018-02-19 MED ORDER — ATORVASTATIN CALCIUM 20 MG PO TABS
40.0000 mg | ORAL_TABLET | Freq: Every day | ORAL | Status: DC
  Administered 2018-02-19 – 2018-02-20 (×2): 40 mg via ORAL
  Filled 2018-02-19 (×2): qty 2

## 2018-02-19 MED ORDER — INSULIN ASPART 100 UNIT/ML ~~LOC~~ SOLN
0.0000 [IU] | Freq: Three times a day (TID) | SUBCUTANEOUS | Status: DC
  Administered 2018-02-19: 5 [IU] via SUBCUTANEOUS
  Administered 2018-02-19 – 2018-02-20 (×3): 1 [IU] via SUBCUTANEOUS
  Filled 2018-02-19 (×4): qty 1

## 2018-02-19 MED ORDER — ASPIRIN EC 81 MG PO TBEC
81.0000 mg | DELAYED_RELEASE_TABLET | Freq: Every day | ORAL | Status: DC
  Administered 2018-02-19 – 2018-02-20 (×2): 81 mg via ORAL
  Filled 2018-02-19 (×2): qty 1

## 2018-02-19 MED ORDER — ENOXAPARIN SODIUM 40 MG/0.4ML ~~LOC~~ SOLN
40.0000 mg | Freq: Two times a day (BID) | SUBCUTANEOUS | Status: DC
  Administered 2018-02-19 – 2018-02-20 (×3): 40 mg via SUBCUTANEOUS
  Filled 2018-02-19 (×3): qty 0.4

## 2018-02-19 MED ORDER — INSULIN ASPART 100 UNIT/ML ~~LOC~~ SOLN: 0.0000 [IU] | Freq: Every day | SUBCUTANEOUS | Status: DC

## 2018-02-19 MED ORDER — ACETAMINOPHEN 160 MG/5ML PO SOLN: 650.0000 mg | ORAL | Status: DC | PRN

## 2018-02-19 MED ORDER — ACETAMINOPHEN 650 MG RE SUPP: 650.0000 mg | RECTAL | Status: DC | PRN

## 2018-02-19 MED ORDER — PERFLUTREN LIPID MICROSPHERE
1.0000 mL | INTRAVENOUS | Status: AC | PRN
  Administered 2018-02-19: 2 mL via INTRAVENOUS
  Filled 2018-02-19: qty 10

## 2018-02-19 MED ORDER — OXYCODONE-ACETAMINOPHEN 5-325 MG PO TABS
1.0000 | ORAL_TABLET | Freq: Four times a day (QID) | ORAL | Status: DC | PRN
  Administered 2018-02-19 (×2): 1 via ORAL
  Filled 2018-02-19 (×3): qty 1

## 2018-02-19 MED ORDER — ACETAMINOPHEN 325 MG PO TABS
650.0000 mg | ORAL_TABLET | ORAL | Status: DC | PRN
  Administered 2018-02-19 (×2): 650 mg via ORAL
  Filled 2018-02-19 (×2): qty 2

## 2018-02-19 MED ORDER — EZETIMIBE 10 MG PO TABS
10.0000 mg | ORAL_TABLET | Freq: Every day | ORAL | Status: DC
  Administered 2018-02-19 – 2018-02-20 (×2): 10 mg via ORAL
  Filled 2018-02-19 (×2): qty 1

## 2018-02-19 MED ORDER — STROKE: EARLY STAGES OF RECOVERY BOOK
Freq: Once | Status: AC
  Administered 2018-02-19: 02:00:00

## 2018-02-19 NOTE — Plan of Care (Signed)
  Problem: Education: Goal: Knowledge of disease or condition will improve Outcome: Progressing Goal: Knowledge of secondary prevention will improve Outcome: Progressing   

## 2018-02-19 NOTE — Progress Notes (Signed)
*  PRELIMINARY RESULTS* Echocardiogram 2D Echocardiogram has been performed.  Sherrie Sport 02/19/2018, 2:56 PM

## 2018-02-19 NOTE — Plan of Care (Signed)
  Problem: Education: Goal: Knowledge of General Education information will improve Description Including pain rating scale, medication(s)/side effects and non-pharmacologic comfort measures Outcome: Progressing   Problem: Health Behavior/Discharge Planning: Goal: Ability to manage health-related needs will improve Outcome: Progressing   Problem: Clinical Measurements: Goal: Ability to maintain clinical measurements within normal limits will improve Outcome: Progressing Goal: Will remain free from infection Outcome: Progressing Goal: Diagnostic test results will improve Outcome: Progressing Goal: Respiratory complications will improve Outcome: Progressing Goal: Cardiovascular complication will be avoided Outcome: Progressing   Problem: Activity: Goal: Risk for activity intolerance will decrease Outcome: Progressing   Problem: Nutrition: Goal: Adequate nutrition will be maintained Outcome: Progressing   Problem: Coping: Goal: Level of anxiety will decrease Outcome: Progressing   Problem: Elimination: Goal: Will not experience complications related to bowel motility Outcome: Progressing Goal: Will not experience complications related to urinary retention Outcome: Progressing   Problem: Pain Managment: Goal: General experience of comfort will improve Outcome: Progressing   Problem: Safety: Goal: Ability to remain free from injury will improve Outcome: Progressing   Problem: Skin Integrity: Goal: Risk for impaired skin integrity will decrease Outcome: Progressing   Problem: Education: Goal: Knowledge of disease or condition will improve Outcome: Progressing Goal: Knowledge of secondary prevention will improve Outcome: Progressing Goal: Knowledge of patient specific risk factors addressed and post discharge goals established will improve Outcome: Progressing Goal: Individualized Educational Video(s) Outcome: Progressing   Problem: Coping: Goal: Will verbalize  positive feelings about self Outcome: Progressing Goal: Will identify appropriate support needs Outcome: Progressing   Problem: Health Behavior/Discharge Planning: Goal: Ability to manage health-related needs will improve Outcome: Progressing   Problem: Self-Care: Goal: Ability to participate in self-care as condition permits will improve Outcome: Progressing Goal: Verbalization of feelings and concerns over difficulty with self-care will improve Outcome: Progressing   Problem: Intracerebral Hemorrhage Tissue Perfusion: Goal: Complications of Intracerebral Hemorrhage will be minimized Outcome: Progressing   Problem: Ischemic Stroke/TIA Tissue Perfusion: Goal: Complications of ischemic stroke/TIA will be minimized Outcome: Progressing   Problem: Spontaneous Subarachnoid Hemorrhage Tissue Perfusion: Goal: Complications of Spontaneous Subarachnoid Hemorrhage will be minimized Outcome: Progressing

## 2018-02-19 NOTE — Evaluation (Signed)
Occupational Therapy Evaluation Patient Details Name: Erik [name] MRN: [id number] DOB: 04-03-1961 Today's Date: 02/19/2018    History of Present Illness Pt. is a 57 y.o. male who was admitted to [facility] with an Occipital Lobe Infarct.     Clinical Impression   Pt. presents with right sided visual field impairments which limits basic ADL and IADL functioning. Pt. Resides at home with his wife. Pt. Was independent with ADLs, and IADL functioning: including meal preparation, and medication management. Pt. Was able to drive. Pt. Wears glasses for distance, and driving, uses a magnifying glass for reading, does not use glasses or a visual aide while on the computer. Pt. was able to track horizontally, and vertically across midline to the left, and right. Pt. Visual scanning/attention was assessed for near space at the tabletop to help determine how vision may be affecting ADL/IADL functioning. Pt. was able to identify 40/40 items on the BIVABA single letter search using symmetrical horizontal visual search patterns, Pt. was able to identify 30/30 items accurately on the BIVABA word search using horizontal rectilinear visual search strategies, Pt. was able to identify 28/40 items on the BIVABA random complex circles search. Pt. used unorganized visual search strategies with 5 omissions on the right, and 6 omissions on the left. Pt. Education was provided about visual compensatory strategies during ADLs, IADLs, and navigating within his environment. Pt. could benefit from additional OT services to review visual compensatory strategies. Pt. would benefit follow-up OT services.     Follow Up Recommendations  Neuro opthalmologist Consult, Outpatient OT    Equipment Recommendations       Recommendations for Other Services       Precautions / Restrictions Restrictions Weight Bearing Restrictions: No      Mobility Bed Mobility    Pt. Up in chair upon arrival              Transfers                      Balance                                           ADL either performed or assessed with clinical judgement   ADL Overall ADL's : Independent;Needs assistance/impaired Eating/Feeding: Independent;Set up   Grooming: Set up;Independent   Upper Body Bathing: Set up;Independent   Lower Body Bathing: Set up;Min guard   Upper Body Dressing : Set up;Independent   Lower Body Dressing: Set up;Min guard                 General ADL Comments: Pt. edcuation was provided aboout visual compensatory strategies.     Vision Baseline Vision/History: Wears glasses Wears Glasses: Distance only(Uses a magnifying glass for reading,  No glasses for computer use.) Patient Visual Report: Blurring of vision;Peripheral vision impairment(Right peripheral field) Vision Assessment?: Vision impaired- to be further tested in functional context     Perception     Praxis      Pertinent Vitals/Pain Pain Assessment: 0-10 Pain Score: 3  Pain Location: Headache Pain Descriptors / Indicators: Aching     Hand Dominance Right   Extremity/Trunk Assessment Upper Extremity Assessment Upper Extremity Assessment: Overall WFL for tasks assessed           Communication Communication Communication: No difficulties   Cognition Arousal/Alertness: Awake/alert Behavior During Therapy: WFL for tasks assessed/performed Overall Cognitive Status:  Within Functional Limits for tasks assessed                                     General Comments       Exercises     Shoulder Instructions      Home Living Family/patient expects to be discharged to:: Private residence Living Arrangements: Spouse/significant other Available Help at Discharge: Family Type of Home: House       Home Layout: One level     Bathroom Shower/Tub: Walk-in shower;Door         Home Equipment: Shower seat - built in          Prior Functioning/Environment Level  of Independence: Independent        Comments: Independent with ADLs, IADLs, driving        OT Problem List: Impaired vision/perception      OT Treatment/Interventions: Self-care/ADL training;Patient/family education;Visual/perceptual remediation/compensation;DME and/or AE instruction    OT Goals(Current goals can be found in the care plan section) Acute Rehab OT Goals Patient Stated Goal: To return home OT Goal Formulation: With patient Potential to Achieve Goals: Good  OT Frequency: Min 1X/week   Barriers to D/C:            Co-evaluation              AM-PAC PT "6 Clicks" Daily Activity     Outcome Measure Help from another person eating meals?: None Help from another person taking care of personal grooming?: None Help from another person toileting, which includes using toliet, bedpan, or urinal?: None Help from another person bathing (including washing, rinsing, drying)?: A Little Help from another person to put on and taking off regular upper body clothing?: None Help from another person to put on and taking off regular lower body clothing?: A Little 6 Click Score: 22   End of Session    Activity Tolerance: Patient tolerated treatment well Patient left: in chair;with call bell/phone within reach;with chair alarm set  OT Visit Diagnosis: Other (comment)(Visual impairment)                Time: 1610-9604 OT Time Calculation (min): 35 min Charges:  OT General Charges $OT Visit: 1 Visit OT Evaluation $OT Eval Moderate Complexity: 1 Mod  Harrel Carina, MS, OTR/L   Harrel Carina 02/19/2018, 11:33 AM

## 2018-02-19 NOTE — Consult Note (Signed)
Referring Physician: Lance Coon, MD    Chief Complaint: Right peripheral vision loss, disorientation, headache  HPI: Erik [name] is an 57 y.o. male with significant history of diabetes mellitus, atrial fibrillation not on anticoagulation, hypertension, hyperlipidemia, obstructive sleep apnea, presenting to the ED on 02/18/2018 with chief complaints of right peripheral vision loss and blurriness.  States that he was driving home from work around 5:30-6pm when he decided to stop at a gas station to use the bathroom.  He reports that when he got out the bathroom he suddenly lost vision on the right, reports seeing black spot and was disoriented. Patient describes episode as painless loss of vision in the right eye without associated vertigo/dizziness. No symptoms of eye redness or pain and tearing associated with visual loss (intermittent angle closure glaucoma). Patient states nothing seem to precipitate episode such as postural changes or exercise, loss of vision when eyes are moved into certain positions of gaze (gaze-evoked amaurosis) or loss of vision after exercise or a hot shower (Uhthoff's symptom) to suggest demyelinating disease of the optic nerve. Patient states that he prior to this episode is had lingering slight bifrontal headache the whole day.  He denies the worst headache of his life during the episode of vision loss. Denies associated altered sensorium, speech abnormality, cranial nerve deficit, seizures, focal motor or sensory deficits, diplopia, nausea or vomiting, ipsilateral or contralateral paralysis/weakness, numbness or tingling, involuntary movements, or tremor. He denies history of head injury or trauma.  Initial NIH stroke scale was 1.  Work-up in the ED including CT head showed right occipital infarct.  He was therefore admitted for further stroke work-up and management.  Date last known well: Date: 02/18/2018 Time last known well: Time: 17:30 tPA Given: No: NIH stroke  scale of 1.  Past Medical History:  Diagnosis Date  . Arthritis   . Diabetes mellitus without complication ([place])   . Dysrhythmia    a-fib  . Edema    FEET/LEGS  . Edema    FEET/LEGS  . History of orthopnea   . Hypertension   . Orthopnea   . Palpitations   . Palpitations   . Sleep apnea     Past Surgical History:  Procedure Laterality Date  . CATARACT EXTRACTION W/PHACO Left 09/10/2015   Procedure: CATARACT EXTRACTION PHACO AND INTRAOCULAR LENS PLACEMENT (IOC);  Surgeon: Estill Cotta, MD;  Location: [facility] ORS;  Service: Ophthalmology;  Laterality: Left;  [place] 00:51 AP% 15.8 CDE 16.04 fluid pack lot # [id number]  . CATARACT EXTRACTION W/PHACO Right 10/01/2015   Procedure: CATARACT EXTRACTION PHACO AND INTRAOCULAR LENS PLACEMENT ([place]);  Surgeon: Estill Cotta, MD;  Location: [facility] ORS;  Service: Ophthalmology;  Laterality: Right;  [place] 51.3 AP% 18.2 CDE 18.28 Fluid pack lot # [id number]  . COLONOSCOPY WITH PROPOFOL N/A 10/26/2014   Procedure: COLONOSCOPY WITH PROPOFOL;  Surgeon: Josefine Class, MD;  Location: [facility] ENDOSCOPY;  Service: Endoscopy;  Laterality: N/A;  . HERNIA REPAIR      Family History  Problem Relation Age of Onset  . Hypertension Mother   . Diabetes Mother   . Hyperlipidemia Mother   . Heart attack Mother    Social History:  reports that he has quit smoking. He does not have any smokeless tobacco history on file. He reports that he does not drink alcohol or use drugs.  Allergies: No Known Allergies  Medications:  I have reviewed the patient's current medications. Prior to Admission:  Medications Prior to Admission  Medication Sig Dispense  Refill Last Dose  . amLODipine (NORVASC) 10 MG tablet Take 10 mg by mouth daily.   02/18/2018 at Unknown time  . aspirin 81 MG chewable tablet Chew 81 mg by mouth daily.   02/18/2018 at Unknown time  . atorvastatin (LIPITOR) 40 MG tablet Take 40 mg by mouth daily.  1 02/18/2018 at Unknown time  . glimepiride  (AMARYL) 4 MG tablet Take 4 mg by mouth daily.  1 02/18/2018 at Unknown time  . glucosamine-chondroitin 500-400 MG tablet Take 1 tablet by mouth 2 (two) times daily.    02/18/2018 at Unknown time  . lisinopril-hydrochlorothiazide (PRINZIDE,ZESTORETIC) 20-12.5 MG per tablet Take 1 tablet by mouth daily.   02/18/2018 at Unknown time  . metFORMIN (GLUCOPHAGE) 500 MG tablet Take 1,000 mg by mouth 2 (two) times daily with a meal.    02/18/2018 at Unknown time  . metoprolol (LOPRESSOR) 100 MG tablet Take 50 mg by mouth 2 (two) times daily.    02/18/2018 at Unknown time  . Multiple Vitamins-Minerals (MULTIVITAMIN WITH MINERALS) tablet Take 1 tablet by mouth daily.   02/18/2018 at Unknown time   Scheduled: . atorvastatin  40 mg Oral Daily  . enoxaparin (LOVENOX) injection  40 mg Subcutaneous BID  . insulin aspart  0-5 Units Subcutaneous QHS  . insulin aspart  0-9 Units Subcutaneous TID WC    ROS: History obtained from the patient   General ROS: negative for - chills, fatigue, fever, night sweats, weight gain or weight loss Psychological ROS: negative for - behavioral disorder, hallucinations, memory difficulties, mood swings or suicidal ideation Ophthalmic ROS: negative for - double vision, eye pain.  Positive for blurry vision, loss of vision on the right ENT ROS: negative for - epistaxis, nasal discharge, oral lesions, sore throat, tinnitus or vertigo Allergy and Immunology ROS: negative for - hives or itchy/watery eyes Hematological and Lymphatic ROS: negative for - bleeding problems, bruising or swollen lymph nodes Endocrine ROS: negative for - galactorrhea, hair pattern changes, polydipsia/polyuria or temperature intolerance Respiratory ROS: negative for - cough, hemoptysis, shortness of breath or wheezing Cardiovascular ROS: negative for - chest pain, dyspnea on exertion, edema or irregular heartbeat Gastrointestinal ROS: negative for - abdominal pain, diarrhea, hematemesis, nausea/vomiting  or stool incontinence Genito-Urinary ROS: negative for - dysuria, hematuria, incontinence or urinary frequency/urgency Musculoskeletal ROS: negative for - joint swelling or muscular weakness Neurological ROS: as noted in HPI Dermatological ROS: negative for rash and skin lesion changes  Physical Examination: Blood pressure (!) 156/108, pulse 85, temperature 98.6 F (37 C), temperature source Oral, resp. rate 18, height 6\' 2"  (1.88 m), weight (!) 170.2 kg, SpO2 94 %.   HEENT-  Normocephalic, no lesions, without obvious abnormality.  Normal external eye and conjunctiva.  Normal TM's bilaterally.  Normal auditory canals and external ears. Normal external nose, mucus membranes and septum.  Normal pharynx. Cardiovascular- S1, S2 normal, pulses palpable throughout   Lungs- chest clear, no wheezing, rales, normal symmetric air entry Abdomen- soft, non-tender; bowel sounds normal; no masses,  no organomegaly Extremities- 2+ lower extremity edema Lymph-no adenopathy palpable Musculoskeletal-no joint tenderness, deformity or swelling Skin-warm and dry, no hyperpigmentation, vitiligo, or suspicious lesions  Neurological Exam   Mental Status: Alert, oriented, thought content appropriate.  Speech fluent without evidence of aphasia.  Able to follow 3 step commands without difficulty. Attention span and concentration seemed appropriate  Cranial Nerves: II: Discs flat bilaterally; RHH, pupils equal, round, reactive to light and accommodation III,IV, VI: mild left ptosis, extra-ocular motions intact  bilaterally V,VII: decreased right NLF, facial light touch sensation intact VIII: hearing normal bilaterally IX,X: gag reflex present XI: bilateral shoulder shrug XII: midline tongue extension Motor: Right :  Upper extremity   5/5 Without pronator drift      Left: Upper extremity   5/5 without pronator drift Right:   Lower extremity   5/5                                          Left: Lower extremity    5/5 Tone and bulk:normal tone throughout; no atrophy noted Sensory: Pinprick and light touch intact bilaterally Deep Tendon Reflexes: 2+ in the upper extremities, trace KJ's bilaterally and absent AJ's bilaterally Plantars: Right: mute                              Left: mute Cerebellar: Finger-to-nose testing intact bilaterally. Heel to shin testing normal bilaterally Gait: not tested due to safety concerns  Data Reviewed  Laboratory Studies:  Basic Metabolic Panel: Recent Labs  Lab 02/18/18 2158  NA 139  K 3.4*  CL 102  CO2 27  GLUCOSE 122*  BUN 18  CREATININE 1.15  CALCIUM 8.9    Liver Function Tests: No results for input(s): AST, ALT, ALKPHOS, BILITOT, PROT, ALBUMIN in the last 168 hours. No results for input(s): LIPASE, AMYLASE in the last 168 hours. No results for input(s): AMMONIA in the last 168 hours.  CBC: Recent Labs  Lab 02/18/18 2158  WBC 9.8  NEUTROABS 6.0  HGB 12.8*  HCT 37.8*  MCV 94.3  PLT 210    Cardiac Enzymes: Recent Labs  Lab 02/19/18 0110  TROPONINI <0.03    BNP: Invalid input(s): POCBNP  CBG: Recent Labs  Lab 02/19/18 0109 02/19/18 0801  GLUCAP 121* 145*    Microbiology: No results found for this or any previous visit.  Coagulation Studies: Recent Labs    02/19/18 0110  LABPROT 13.6  INR 1.05    Urinalysis:  Recent Labs  Lab 02/18/18 2322  COLORURINE STRAW*  LABSPEC 1.006  PHURINE 7.0  GLUCOSEU 50*  HGBUR NEGATIVE  BILIRUBINUR NEGATIVE  KETONESUR NEGATIVE  PROTEINUR 100*  NITRITE NEGATIVE  LEUKOCYTESUR NEGATIVE    Lipid Panel:    Component Value Date/Time   CHOL 157 02/19/2018 0110   TRIG 152 (H) 02/19/2018 0110   HDL 40 (L) 02/19/2018 0110   CHOLHDL 3.9 02/19/2018 0110   VLDL 30 02/19/2018 0110   LDLCALC 87 02/19/2018 0110    HgbA1C:  Lab Results  Component Value Date   HGBA1C 6.7 (H) 02/19/2018    Urine Drug Screen:      Component Value Date/Time   LABOPIA NONE DETECTED 02/18/2018 2322    COCAINSCRNUR NONE DETECTED 02/18/2018 2322   LABBENZ NONE DETECTED 02/18/2018 2322   AMPHETMU NONE DETECTED 02/18/2018 2322   THCU NONE DETECTED 02/18/2018 2322   LABBARB NONE DETECTED 02/18/2018 2322    Alcohol Level:  Recent Labs  Lab 02/19/18 0110  ETH <10    Other results: EKG: atrial fibrillation, rate 61, RBBB. Vent. rate 61 BPM PR interval * ms QRS duration 110 ms QT/QTc 443/447 ms P-R-T axes * 257 -3  Imaging: Ct Head Wo Contrast  Result Date: 02/18/2018 CLINICAL DATA:  Headache.  Loss peripheral vision in right eye. EXAM: CT HEAD WITHOUT CONTRAST TECHNIQUE: Contiguous  axial images were obtained from the base of the skull through the vertex without intravenous contrast. COMPARISON:  None. FINDINGS: Brain: Area of low attenuation peripherally in the right occipital lobe could be an acute or subacute infarct and could certainly affect a visual field. No findings for hemispheric infarction or intracranial hemorrhage. No mass lesions are identified. The brainstem and cerebellum are grossly normal. Vascular: No hyperdense vessel or unexpected calcification. Skull: No skull fracture or bone lesion. Sinuses/Orbits: Ethmoid and maxillary sinus disease. The mastoid air cells and middle ear cavities are clear. The globes are intact. Other: No scalp lesions or hematoma. There is a small soft tissue mass in the right nasal area measures 16 x 11 mm. Recommend clinical correlation. IMPRESSION: 1. Right occipital infarct could be acute or subacute. MRI suggested for further evaluation. This location could certainly cause a visual field cut. 2. No intracranial hemorrhage or mass lesion. 3. Ethmoid and maxillary sinus disease. 4. Subcutaneous mass in the right nasal area. Electronically Signed   By: Marijo Sanes M.D.   On: 02/18/2018 19:47    Patient seen and examined.  Clinical course and management discussed.  Necessary edits performed.  I agree with the above.  Assessment and plan of care  developed and discussed below.    Assessment: 57 y.o. male with pertinent history of of diabetes mellitus, atrial fibrillation not on anticoagulation, hypertension, hyperlipidemia, obstructive sleep apnea, presenting with chief complaints of right peripheral vision loss and blurriness.  Etiology likely embolic.  CT head reviewed and showed right occipital infarct.  Patient presented quite soon after onset of symptoms therefore would not expect acute event to be present on CT.  Also location not consistent with symptoms.  Hemoglobin A1c 6.7, LDL 87.  Patient has history of atrial fibrillation currently not on anticoagulation.  He reports that he was taking aspirin 81 mg prior to this episode.  Stroke Risk Factors - atrial fibrillation, diabetes mellitus, family history, hyperlipidemia and hypertension  Plan: 1. MRI of the brain without contrast pending 2. CTA head and neck 3. PT consult, OT consult, Speech consult 4. Echocardiogram pending 5. Prophylactic therapy- Will determine timing of change to anticoagulation once MRI reviewed.   6. NPO until RN stroke swallow screen 7. Telemetry monitoring 8. Frequent neuro checks 9. Aggressive lipid management with target LDL<70.   This patient was staffed with Dr. Magda Paganini, Doy Mince who personally evaluated patient, reviewed documentation and agreed with assessment and plan of care as above.  Rufina Falco, DNP, FNP-BC Board certified Nurse Practitioner Neurology Department  02/19/2018, 11:22 AM  Alexis Goodell, MD Neurology [phone]  02/19/2018  12:23 PM

## 2018-02-19 NOTE — Progress Notes (Signed)
Patient was transferred from the ER following acute left occipital infarct. On admission patient was A&O X4 and ambulatory, received tylenol for acute HA. NIH score was 1, patient still complain of impaired right peripheral vision. Patient on Q2 VS per order. NO acute event overnight.

## 2018-02-19 NOTE — Progress Notes (Signed)
PT Cancellation Note  Patient Details Name: Erik [name] MRN: [id number] DOB: [birth year]   Cancelled Treatment:     PT orders received chart reviewed. Upon arrival pt eager to get out of bed and ambulate. Pt reporting 3/10 headache and blurry vision in R visual field in R eye but he reports this is improving. Pt quick to rise no concerns for balance or strength, pt indicating his strength is at baseline. Pt ambulating 400 feet safe and confidently with no difficulty or needs for AD. No further skilled PT required at this time will sign off on PT orders.    [name], SPT 02/19/2018, 12:03 PM

## 2018-02-19 NOTE — Progress Notes (Signed)
lovenox changed to 40 BID for BMI >40 and CrCl >30.

## 2018-02-19 NOTE — Progress Notes (Addendum)
SLP Cancellation Note  Patient Details Name: Erik [name] MRN: [id number] DOB: October 20, 1960   Cancelled treatment:       Reason Eval/Treat Not Completed: SLP screened, no needs identified, will sign off(chart reviewed; consulted NSG then met w/ pt in room). Pt denied any difficulty swallowing and is currently on a regular diet; tolerates swallowing pills w/ water w/ NSG(currently while in room). Pt conversed at conversational level w/out deficits noted; appropriate speech. Pt denied any speech-language deficits but endorsed worsened vision deficits - pt does have vision deficits at baseline and uses a magnifying glass for some reading at baseline (per his report). He stated he is reading his texts and comprehending them but having min difficulty "seeing all the words". His Wife is bringing his magnifying glass from home today. Updated OT re: pt's report.  No further skilled ST services indicated as pt appears at his baseline for speech-language and swallowing. Pt agreed. NSG to reconsult if any change in status.     Orinda Kenner, MS, CCC-SLP Watson,Katherine 02/19/2018, 8:49 AM

## 2018-02-19 NOTE — Progress Notes (Signed)
[facility] [place] NAME: Erik [name]    MR#:  [id number]  DATE OF BIRTH:  [name] 20, 1962  SUBJECTIVE: Patient admitted for right peripheral vision loss, headache, disorientation.  Still has vision trouble/  CHIEF COMPLAINT:   Chief Complaint  Patient presents with  . Headache    REVIEW OF SYSTEMS:   ROS CONSTITUTIONAL: No fever, fatigue or weakness.  EYES: Visual deficit in the right side, headache. EARS, NOSE, AND THROAT: No tinnitus or ear pain.  RESPIRATORY: No cough, shortness of breath, wheezing or hemoptysis.  CARDIOVASCULAR: No chest pain, orthopnea, edema.  GASTROINTESTINAL: No nausea, vomiting, diarrhea or abdominal pain.  GENITOURINARY: No dysuria, hematuria.  ENDOCRINE: No polyuria, nocturia,  HEMATOLOGY: No anemia, easy bruising or bleeding SKIN: No rash or lesion. MUSCULOSKELETAL: No joint pain or arthritis.   NEUROLOGIC: No tingling, numbness, weakness.  PSYCHIATRY: No anxiety or depression.   DRUG ALLERGIES:  No Known Allergies  VITALS:  Blood pressure (!) 156/108, pulse 85, temperature 98.6 F (37 C), temperature source Oral, resp. rate 18, height 6\' 2"  (1.88 m), weight (!) 170.2 kg, SpO2 94 %.  PHYSICAL EXAMINATION:  GENERAL:  57 y.o.-year-old patient lying in the bed with no acute distress.  EYES: Pupils equal, round, reactive to light and accommodation. No scleral icterus. Extraocular muscles intact.  HEENT: Head atraumatic, normocephalic. Oropharynx and nasopharynx clear.  NECK:  Supple, no jugular venous distention. No thyroid enlargement, no tenderness.  LUNGS: Normal breath sounds bilaterally, no wheezing, rales,rhonchi or crepitation. No use of accessory muscles of respiration.  CARDIOVASCULAR: S1, S2 normal. No murmurs, rubs, or gallops.  ABDOMEN: Soft, nontender, nondistended. Bowel sounds present. No organomegaly or mass.  EXTREMITIES: No pedal edema, cyanosis, or clubbing.  NEUROLOGIC:  Cranial nerves II through XII are intact. Muscle strength 5/5 in all extremities. Sensation intact. Gait not checked.  And has decreased peripheral vision on the right side. PSYCHIATRIC: The patient is alert and oriented x 3.  SKIN: No obvious rash, lesion, or ulcer.    LABORATORY PANEL:   CBC Recent Labs  Lab 02/18/18 2158  WBC 9.8  HGB 12.8*  HCT 37.8*  PLT 210   ------------------------------------------------------------------------------------------------------------------  Chemistries  Recent Labs  Lab 02/18/18 2158  NA 139  K 3.4*  CL 102  CO2 27  GLUCOSE 122*  BUN 18  CREATININE 1.15  CALCIUM 8.9   ------------------------------------------------------------------------------------------------------------------  Cardiac Enzymes Recent Labs  Lab 02/19/18 0110  TROPONINI <0.03   ------------------------------------------------------------------------------------------------------------------  RADIOLOGY:  Ct Angio Head W Or Wo Contrast  Result Date: 02/19/2018 CLINICAL DATA:  Stroke follow-up.  Peripheral vision loss EXAM: CT ANGIOGRAPHY HEAD AND NECK TECHNIQUE: Multidetector CT imaging of the head and neck was performed using the standard protocol during bolus administration of intravenous contrast. Multiplanar CT image reconstructions and MIPs were obtained to evaluate the vascular anatomy. Carotid stenosis measurements (when applicable) are obtained utilizing NASCET criteria, using the distal internal carotid diameter as the denominator. CONTRAST:  72mL ISOVUE-370 IOPAMIDOL (ISOVUE-370) INJECTION 76% COMPARISON:  Head CT from yesterday FINDINGS: CTA NECK FINDINGS Aortic arch: Atherosclerotic plaque.  Two vessel branching. Right carotid system: Moderate calcified plaque at the bifurcation without ulceration or flow limiting stenosis. Left carotid system: Mild to moderate calcified plaque at the bifurcation. Mild atheromatous wall thickening of the common carotid. No  flow limiting stenosis or ulceration. Vertebral arteries: No proximal subclavian stenosis. Calcified plaque at both vertebral ostia. Due to bolus density and patient size vertebral origins are  not well visualized. No flow limiting stenosis is suspected. Skeleton: Degenerative changes without acute or aggressive finding. Other neck: Parotid calcifications bilaterally. Moderate mucosal thickening in ethmoid sinuses. Upper chest: There are enlarged lymph nodes in the mediastinum, with 37 x 15 mm diameter noted in the AP window. Ground-glass density in the lungs which could be from expiratory phase imaging or infiltrate. Review of the MIP images confirms the above findings CTA HEAD FINDINGS Anterior circulation: No branch occlusion, beading, or flow limiting stenosis. Inferiorly projected aneurysm from the left carotid terminus measuring 4.5 Mm base to dome, with anterior sac lobulation seen on the lateral view. Posterior circulation: Mild left vertebral artery dominance. No branch occlusion, beading, or aneurysm. Venous sinuses: Patent Anatomic variants: None significant Delayed phase: Negative Review of the MIP images confirms the above findings IMPRESSION: 1. No emergent finding. 2. Cervical carotid atherosclerosis without flow limiting stenosis. 3. 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 4. Enlarged mediastinal lymph nodes, recommend nonemergent chest CT. Electronically Signed   By: Monte Fantasia M.D.   On: 02/19/2018 13:02   Ct Head Wo Contrast  Result Date: 02/18/2018 CLINICAL DATA:  Headache.  Loss peripheral vision in right eye. EXAM: CT HEAD WITHOUT CONTRAST TECHNIQUE: Contiguous axial images were obtained from the base of the skull through the vertex without intravenous contrast. COMPARISON:  None. FINDINGS: Brain: Area of low attenuation peripherally in the right occipital lobe could be an acute or subacute infarct and could certainly affect a visual field. No findings for  hemispheric infarction or intracranial hemorrhage. No mass lesions are identified. The brainstem and cerebellum are grossly normal. Vascular: No hyperdense vessel or unexpected calcification. Skull: No skull fracture or bone lesion. Sinuses/Orbits: Ethmoid and maxillary sinus disease. The mastoid air cells and middle ear cavities are clear. The globes are intact. Other: No scalp lesions or hematoma. There is a small soft tissue mass in the right nasal area measures 16 x 11 mm. Recommend clinical correlation. IMPRESSION: 1. Right occipital infarct could be acute or subacute. MRI suggested for further evaluation. This location could certainly cause a visual field cut. 2. No intracranial hemorrhage or mass lesion. 3. Ethmoid and maxillary sinus disease. 4. Subcutaneous mass in the right nasal area. Electronically Signed   By: Marijo Sanes M.D.   On: 02/18/2018 19:47   Ct Angio Neck W Or Wo Contrast  Result Date: 02/19/2018 CLINICAL DATA:  Stroke follow-up.  Peripheral vision loss EXAM: CT ANGIOGRAPHY HEAD AND NECK TECHNIQUE: Multidetector CT imaging of the head and neck was performed using the standard protocol during bolus administration of intravenous contrast. Multiplanar CT image reconstructions and MIPs were obtained to evaluate the vascular anatomy. Carotid stenosis measurements (when applicable) are obtained utilizing NASCET criteria, using the distal internal carotid diameter as the denominator. CONTRAST:  53mL ISOVUE-370 IOPAMIDOL (ISOVUE-370) INJECTION 76% COMPARISON:  Head CT from yesterday FINDINGS: CTA NECK FINDINGS Aortic arch: Atherosclerotic plaque.  Two vessel branching. Right carotid system: Moderate calcified plaque at the bifurcation without ulceration or flow limiting stenosis. Left carotid system: Mild to moderate calcified plaque at the bifurcation. Mild atheromatous wall thickening of the common carotid. No flow limiting stenosis or ulceration. Vertebral arteries: No proximal subclavian  stenosis. Calcified plaque at both vertebral ostia. Due to bolus density and patient size vertebral origins are not well visualized. No flow limiting stenosis is suspected. Skeleton: Degenerative changes without acute or aggressive finding. Other neck: Parotid calcifications bilaterally. Moderate mucosal thickening in ethmoid sinuses. Upper  chest: There are enlarged lymph nodes in the mediastinum, with 37 x 15 mm diameter noted in the AP window. Ground-glass density in the lungs which could be from expiratory phase imaging or infiltrate. Review of the MIP images confirms the above findings CTA HEAD FINDINGS Anterior circulation: No branch occlusion, beading, or flow limiting stenosis. Inferiorly projected aneurysm from the left carotid terminus measuring 4.5 Mm base to dome, with anterior sac lobulation seen on the lateral view. Posterior circulation: Mild left vertebral artery dominance. No branch occlusion, beading, or aneurysm. Venous sinuses: Patent Anatomic variants: None significant Delayed phase: Negative Review of the MIP images confirms the above findings IMPRESSION: 1. No emergent finding. 2. Cervical carotid atherosclerosis without flow limiting stenosis. 3. 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 4. Enlarged mediastinal lymph nodes, recommend nonemergent chest CT. Electronically Signed   By: Monte Fantasia M.D.   On: 02/19/2018 13:02   Mr Brain Wo Contrast  Addendum Date: 02/19/2018   ADDENDUM REPORT: 02/19/2018 13:12 ADDENDUM: Omitted finding of 14 x 6 mm subcutaneous mass over the right nasal arch that is T1 hyperintense, favor proteinaceous inclusion cyst or dermoid. Electronically Signed   By: Monte Fantasia M.D.   On: 02/19/2018 13:12   Result Date: 02/19/2018 CLINICAL DATA:  Acute onset of visual disturbance EXAM: MRI HEAD WITHOUT CONTRAST TECHNIQUE: Multiplanar, multiecho pulse sequences of the brain and surrounding structures were obtained without intravenous  contrast. COMPARISON:  Head CT from yesterday FINDINGS: Brain: Moderate cortically based infarct in the left occipital cortex. Punctate acute infarct in right parietal cortex. There is also a probable subacute right frontal parietal white matter infarct. Small remote cortically based infarct in the right parietal lobe. No acute hemorrhage, hydrocephalus, or masslike finding. Small vessel ischemic type changes in the cerebral white matter to a mild/moderate degree. Vascular: Contemporaneous CTA. Skull and upper cervical spine: No evidence of marrow lesion. Sinuses/Orbits: Bilateral cataract resection. Sinusitis with moderate mucosal thickening in the ethmoids. IMPRESSION: 1. Moderate acute infarct in the left occipital cortex. Small acute and subacute infarcts in the right frontal parietal lobe. Suspect central origin in this patient with history of atrial fibrillation. 2. Small remote right parietal cortex infarct. 3. Chronic small vessel ischemia. Electronically Signed: By: Monte Fantasia M.D. On: 02/19/2018 13:08    EKG:   Orders placed or performed during the hospital encounter of 02/18/18  . ED EKG  . ED EKG    ASSESSMENT AND PLAN:   37. 57 year old male patient with multiple medical problems of diabetes mellitus type 2, chronic atrial fibrillation not on anticoagulation, hyperlipidemia, hypertension, sleep apnea comes in because of right peripheral vision loss, blurriness, headache.  1. acute stroke in the left occipital cortex and also acute to subacute infarct in the right frontal parietal lobe, likely embolic stroke, patient is to be on full dose anticoagulation.  Neurology, they recommend CT angios head and neck, PT and OT consult, follow echocardiogram, continue aspirin, monitor on telemetry, patient will be started on full dose anticoagulation today. 2 diabetes mellitus type 2: Controlled. 3.  Chronic A. fib: Patient will be on full dose anticoagulation due to multifocal stroke and  hypokalemia, replace potassium. #4 essential hypertension, patient takes Norvasc at home along with Prinzide, metoprolol.  Start on metoprolol, permissive hypertension due to acute stroke.  . All the records are reviewed and case discussed with Care Management/Social Workerr. Management plans discussed with the patient, family and they are in agreement.  CODE STATUS: FULL  TOTAL TIME TAKING CARE OF THIS PATIENT: 40 minutes.   POSSIBLE D/C IN 2-3 DAYS, DEPENDING ON CLINICAL CONDITION.   Epifanio Lesches M.D on 02/19/2018 at 1:19 PM  Between 7am to 6pm - Pager - [phone]  After 6pm go to [email or web address] - password [email or web address] [facility]  Office  [phone]  CC: Primary care physician; Juluis Pitch, MD   Note: This dictation was prepared with Dragon dictation along with smaller phrase technology. Any transcriptional errors that result from this process are unintentional.

## 2018-02-20 LAB — GLUCOSE, CAPILLARY: Glucose-Capillary: 142 mg/dL — ABNORMAL HIGH (ref 70–99)

## 2018-02-20 MED ORDER — APIXABAN 5 MG PO TABS: 5.0000 mg | ORAL_TABLET | Freq: Two times a day (BID) | ORAL | Status: DC

## 2018-02-20 MED ORDER — APIXABAN 5 MG PO TABS: 5.0000 mg | ORAL_TABLET | Freq: Two times a day (BID) | ORAL | 0 refills | Status: DC

## 2018-02-20 MED ORDER — CLOPIDOGREL BISULFATE 75 MG PO TABS: 75.0000 mg | ORAL_TABLET | Freq: Every day | ORAL | 0 refills | Status: DC

## 2018-02-20 NOTE — Progress Notes (Signed)
Discharge instructions given to patient. Case manager delivered eliquis coupon. Patient verbalized understanding with no further questions. IV taken out and tele monitor off. Patient going home via family vehicle in stable condition.

## 2018-02-20 NOTE — Plan of Care (Signed)
  Problem: Education: Goal: Knowledge of General Education information will improve Description Including pain rating scale, medication(s)/side effects and non-pharmacologic comfort measures Outcome: Adequate for Discharge   Problem: Health Behavior/Discharge Planning: Goal: Ability to manage health-related needs will improve Outcome: Adequate for Discharge   Problem: Clinical Measurements: Goal: Ability to maintain clinical measurements within normal limits will improve Outcome: Adequate for Discharge Goal: Will remain free from infection Outcome: Adequate for Discharge Goal: Diagnostic test results will improve Outcome: Adequate for Discharge Goal: Respiratory complications will improve Outcome: Adequate for Discharge Goal: Cardiovascular complication will be avoided Outcome: Adequate for Discharge   Problem: Activity: Goal: Risk for activity intolerance will decrease Outcome: Adequate for Discharge   Problem: Nutrition: Goal: Adequate nutrition will be maintained Outcome: Adequate for Discharge   Problem: Coping: Goal: Level of anxiety will decrease Outcome: Adequate for Discharge   Problem: Elimination: Goal: Will not experience complications related to bowel motility Outcome: Adequate for Discharge Goal: Will not experience complications related to urinary retention Outcome: Adequate for Discharge   Problem: Pain Managment: Goal: General experience of comfort will improve Outcome: Adequate for Discharge   Problem: Safety: Goal: Ability to remain free from injury will improve Outcome: Adequate for Discharge   Problem: Skin Integrity: Goal: Risk for impaired skin integrity will decrease Outcome: Adequate for Discharge   Problem: Education: Goal: Knowledge of disease or condition will improve Outcome: Adequate for Discharge Goal: Knowledge of secondary prevention will improve Outcome: Adequate for Discharge Goal: Knowledge of patient specific risk factors  addressed and post discharge goals established will improve Outcome: Adequate for Discharge Goal: Individualized Educational Video(s) Outcome: Adequate for Discharge   Problem: Coping: Goal: Will verbalize positive feelings about self Outcome: Adequate for Discharge Goal: Will identify appropriate support needs Outcome: Adequate for Discharge   Problem: Health Behavior/Discharge Planning: Goal: Ability to manage health-related needs will improve Outcome: Adequate for Discharge   Problem: Self-Care: Goal: Ability to participate in self-care as condition permits will improve Outcome: Adequate for Discharge Goal: Verbalization of feelings and concerns over difficulty with self-care will improve Outcome: Adequate for Discharge   Problem: Intracerebral Hemorrhage Tissue Perfusion: Goal: Complications of Intracerebral Hemorrhage will be minimized Outcome: Adequate for Discharge   Problem: Ischemic Stroke/TIA Tissue Perfusion: Goal: Complications of ischemic stroke/TIA will be minimized Outcome: Adequate for Discharge   Problem: Spontaneous Subarachnoid Hemorrhage Tissue Perfusion: Goal: Complications of Spontaneous Subarachnoid Hemorrhage will be minimized Outcome: Adequate for Discharge

## 2018-02-24 DIAGNOSIS — I6340 Cerebral infarction due to embolism of unspecified cerebral artery: Secondary | ICD-10-CM | POA: Diagnosis not present

## 2018-02-24 NOTE — Discharge Summary (Signed)
Erik [name], is a 57 y.o. male  DOB Jan 18, 1961  MRN [id number].  Admission date:  02/18/2018  Admitting Physician  Lance Coon, MD  Discharge Date:  02/20/2018   Primary MD  Juluis Pitch, MD  Recommendations for primary care physician for things to follow:   Follow-up with PCP  in 1 week Advised to follow-up with neuro regarding new stroke   Admission Diagnosis  Visual field defect [H53.40] Cerebrovascular accident (CVA), unspecified mechanism ([place]) [I63.9]   Discharge Diagnosis  Visual field defect [H53.40] Cerebrovascular accident (CVA), unspecified mechanism ([place]) [I63.9]   Principal Problem:   Stroke [facility]) Active Problems:   HTN (hypertension)   Diabetes ([place])      Past Medical History:  Diagnosis Date  . Arthritis   . Diabetes mellitus without complication ([place])   . Dysrhythmia    a-fib  . Edema    FEET/LEGS  . Edema    FEET/LEGS  . History of orthopnea   . Hypertension   . Orthopnea   . Palpitations   . Palpitations   . Sleep apnea     Past Surgical History:  Procedure Laterality Date  . CATARACT EXTRACTION W/PHACO Left 09/10/2015   Procedure: CATARACT EXTRACTION PHACO AND INTRAOCULAR LENS PLACEMENT (IOC);  Surgeon: Estill Cotta, MD;  Location: [facility] ORS;  Service: Ophthalmology;  Laterality: Left;  [place] 00:51 AP% 15.8 CDE 16.04 fluid pack lot # [id number]  . CATARACT EXTRACTION W/PHACO Right 10/01/2015   Procedure: CATARACT EXTRACTION PHACO AND INTRAOCULAR LENS PLACEMENT ([place]);  Surgeon: Estill Cotta, MD;  Location: [facility] ORS;  Service: Ophthalmology;  Laterality: Right;  [place] 51.3 AP% 18.2 CDE 18.28 Fluid pack lot # [id number]  . COLONOSCOPY WITH PROPOFOL N/A 10/26/2014   Procedure: COLONOSCOPY WITH PROPOFOL;  Surgeon: Josefine Class, MD;  Location: [facility] ENDOSCOPY;  Service:  Endoscopy;  Laterality: N/A;  . HERNIA REPAIR         History of present illness and  Hospital Course:     Kindly see H&P for history of present illness and admission details, please review complete Labs, Consult reports and Test reports for all details in brief  HPI  from the history and physical done on the day of admission 57 year old male patient with history of diabetes mellitus type 2, hypertension, obesity came in because of right peripheral vision loss, headache, disorientation.  Patient found to have a occipital stroke by CT head.  And admitted for the same.   Hospital Course  Acute CVA: Likely embolic given due to history of atrial fibrillation and not on anticoagulation.  Initial CT head showed right occipital stroke, MRI of the brain showed multifocal stroke in the left occipital cortex, acute to subacute infarct in right frontal parietal lobe, patient received Plavix in addition to aspirin.  Neurologist recommended starting Eliquis after 5 days of diagnosis of CVA, discharged home as he wanted to go home and told him that he needs to be on Eliquis for being on aspirin Plavix for total of 5 days.  He understands.  Patient has right peripheral vision loss improved by the time of discharge.  He was alert, awake, oriented, no other neurological deficit. Charge home with aspirin, Plavix, statins. Echocardiogram did not show any , PFO.  Normal LV ejection fraction. 2 left carotid aneurysm of 4.5 mm, advised the patient to follow-up with PCP.  No intervention is needed on this admission.  3 .diabetes mellitus type 2, patient is on Amaryl, metformin.  Discharge Condition: Stable  Follow UP  Follow-up Information    Juluis Pitch, MD. Call in 4 day(s).   Specialty:  Family Medicine Why:  Advised to call on Monday and make that appointment. Contact information: [address] [place] [address] [phone]        Vladimir Crofts, MD. Call.   Specialty:   Neurology Why:  On Monday for new patient appointment. Contact information: [facility] [place] [address] [phone]             Discharge Instructions  and  Discharge Medications      Allergies as of 02/20/2018   No Known Allergies     Medication List    TAKE these medications   amLODipine 10 MG tablet Commonly known as:  NORVASC Take 10 mg by mouth daily.   apixaban 5 MG Tabs tablet Commonly known as:  ELIQUIS Take 1 tablet (5 mg total) by mouth 2 (two) times daily. Start on November 6.   aspirin 81 MG chewable tablet Chew 81 mg by mouth daily.   atorvastatin 40 MG tablet Commonly known as:  LIPITOR Take 40 mg by mouth daily.   clopidogrel 75 MG tablet Commonly known as:  PLAVIX Take 1 tablet (75 mg total) by mouth daily.   glimepiride 4 MG tablet Commonly known as:  AMARYL Take 4 mg by mouth daily.   glucosamine-chondroitin 500-400 MG tablet Take 1 tablet by mouth 2 (two) times daily.   lisinopril-hydrochlorothiazide 20-12.5 MG tablet Commonly known as:  PRINZIDE,ZESTORETIC Take 1 tablet by mouth daily.   metFORMIN 500 MG tablet Commonly known as:  GLUCOPHAGE Take 1,000 mg by mouth 2 (two) times daily with a meal.   metoprolol tartrate 100 MG tablet Commonly known as:  LOPRESSOR Take 50 mg by mouth 2 (two) times daily.   multivitamin with minerals tablet Take 1 tablet by mouth daily.         Diet and Activity recommendation: See Discharge Instructions above   Consults obtained -neurology   Major procedures and Radiology Reports - PLEASE review detailed and final reports for all details, in brief -       Ct Angio Head W Or Wo Contrast  Result Date: 02/19/2018 CLINICAL DATA:  Stroke follow-up.  Peripheral vision loss EXAM: CT ANGIOGRAPHY HEAD AND NECK TECHNIQUE: Multidetector CT imaging of the head and neck was performed using the standard protocol during bolus administration of  intravenous contrast. Multiplanar CT image reconstructions and MIPs were obtained to evaluate the vascular anatomy. Carotid stenosis measurements (when applicable) are obtained utilizing NASCET criteria, using the distal internal carotid diameter as the denominator. CONTRAST:  97mL ISOVUE-370 IOPAMIDOL (ISOVUE-370) INJECTION 76% COMPARISON:  Head CT from yesterday FINDINGS: CTA NECK FINDINGS Aortic arch: Atherosclerotic plaque.  Two vessel branching. Right carotid system: Moderate calcified plaque at the bifurcation without ulceration or flow limiting stenosis. Left carotid system: Mild to moderate calcified plaque at the bifurcation. Mild atheromatous wall thickening of the common carotid. No flow limiting stenosis or ulceration. Vertebral arteries: No proximal subclavian stenosis. Calcified plaque at both vertebral ostia. Due to bolus density and patient size vertebral origins are not well visualized. No flow limiting stenosis is suspected. Skeleton: Degenerative changes without acute or aggressive finding. Other neck: Parotid calcifications bilaterally. Moderate mucosal thickening in ethmoid sinuses. Upper chest: There are enlarged lymph nodes in the mediastinum, with 37 x 15 mm diameter noted in the AP window. Ground-glass density in the lungs which could be from expiratory  phase imaging or infiltrate. Review of the MIP images confirms the above findings CTA HEAD FINDINGS Anterior circulation: No branch occlusion, beading, or flow limiting stenosis. Inferiorly projected aneurysm from the left carotid terminus measuring 4.5 Mm base to dome, with anterior sac lobulation seen on the lateral view. Posterior circulation: Mild left vertebral artery dominance. No branch occlusion, beading, or aneurysm. Venous sinuses: Patent Anatomic variants: None significant Delayed phase: Negative Review of the MIP images confirms the above findings IMPRESSION: 1. No emergent finding. 2. Cervical carotid atherosclerosis without flow  limiting stenosis. 3. 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 4. Enlarged mediastinal lymph nodes, recommend nonemergent chest CT. Electronically Signed   By: Monte Fantasia M.D.   On: 02/19/2018 13:02   Ct Head Wo Contrast  Result Date: 02/18/2018 CLINICAL DATA:  Headache.  Loss peripheral vision in right eye. EXAM: CT HEAD WITHOUT CONTRAST TECHNIQUE: Contiguous axial images were obtained from the base of the skull through the vertex without intravenous contrast. COMPARISON:  None. FINDINGS: Brain: Area of low attenuation peripherally in the right occipital lobe could be an acute or subacute infarct and could certainly affect a visual field. No findings for hemispheric infarction or intracranial hemorrhage. No mass lesions are identified. The brainstem and cerebellum are grossly normal. Vascular: No hyperdense vessel or unexpected calcification. Skull: No skull fracture or bone lesion. Sinuses/Orbits: Ethmoid and maxillary sinus disease. The mastoid air cells and middle ear cavities are clear. The globes are intact. Other: No scalp lesions or hematoma. There is a small soft tissue mass in the right nasal area measures 16 x 11 mm. Recommend clinical correlation. IMPRESSION: 1. Right occipital infarct could be acute or subacute. MRI suggested for further evaluation. This location could certainly cause a visual field cut. 2. No intracranial hemorrhage or mass lesion. 3. Ethmoid and maxillary sinus disease. 4. Subcutaneous mass in the right nasal area. Electronically Signed   By: Marijo Sanes M.D.   On: 02/18/2018 19:47   Ct Angio Neck W Or Wo Contrast  Result Date: 02/19/2018 CLINICAL DATA:  Stroke follow-up.  Peripheral vision loss EXAM: CT ANGIOGRAPHY HEAD AND NECK TECHNIQUE: Multidetector CT imaging of the head and neck was performed using the standard protocol during bolus administration of intravenous contrast. Multiplanar CT image reconstructions and MIPs were obtained to  evaluate the vascular anatomy. Carotid stenosis measurements (when applicable) are obtained utilizing NASCET criteria, using the distal internal carotid diameter as the denominator. CONTRAST:  89mL ISOVUE-370 IOPAMIDOL (ISOVUE-370) INJECTION 76% COMPARISON:  Head CT from yesterday FINDINGS: CTA NECK FINDINGS Aortic arch: Atherosclerotic plaque.  Two vessel branching. Right carotid system: Moderate calcified plaque at the bifurcation without ulceration or flow limiting stenosis. Left carotid system: Mild to moderate calcified plaque at the bifurcation. Mild atheromatous wall thickening of the common carotid. No flow limiting stenosis or ulceration. Vertebral arteries: No proximal subclavian stenosis. Calcified plaque at both vertebral ostia. Due to bolus density and patient size vertebral origins are not well visualized. No flow limiting stenosis is suspected. Skeleton: Degenerative changes without acute or aggressive finding. Other neck: Parotid calcifications bilaterally. Moderate mucosal thickening in ethmoid sinuses. Upper chest: There are enlarged lymph nodes in the mediastinum, with 37 x 15 mm diameter noted in the AP window. Ground-glass density in the lungs which could be from expiratory phase imaging or infiltrate. Review of the MIP images confirms the above findings CTA HEAD FINDINGS Anterior circulation: No branch occlusion, beading, or flow limiting stenosis. Inferiorly projected aneurysm  from the left carotid terminus measuring 4.5 Mm base to dome, with anterior sac lobulation seen on the lateral view. Posterior circulation: Mild left vertebral artery dominance. No branch occlusion, beading, or aneurysm. Venous sinuses: Patent Anatomic variants: None significant Delayed phase: Negative Review of the MIP images confirms the above findings IMPRESSION: 1. No emergent finding. 2. Cervical carotid atherosclerosis without flow limiting stenosis. 3. 4.5  Mm left carotid terminus aneurysm with mild sac  lobulation. 4. Enlarged mediastinal lymph nodes, recommend nonemergent chest CT. Electronically Signed   By: Monte Fantasia M.D.   On: 02/19/2018 13:02   Mr Brain Wo Contrast  Addendum Date: 02/19/2018   ADDENDUM REPORT: 02/19/2018 13:12 ADDENDUM: Omitted finding of 14 x 6 mm subcutaneous mass over the right nasal arch that is T1 hyperintense, favor proteinaceous inclusion cyst or dermoid. Electronically Signed   By: Monte Fantasia M.D.   On: 02/19/2018 13:12   Result Date: 02/19/2018 CLINICAL DATA:  Acute onset of visual disturbance EXAM: MRI HEAD WITHOUT CONTRAST TECHNIQUE: Multiplanar, multiecho pulse sequences of the brain and surrounding structures were obtained without intravenous contrast. COMPARISON:  Head CT from yesterday FINDINGS: Brain: Moderate cortically based infarct in the left occipital cortex. Punctate acute infarct in right parietal cortex. There is also a probable subacute right frontal parietal white matter infarct. Small remote cortically based infarct in the right parietal lobe. No acute hemorrhage, hydrocephalus, or masslike finding. Small vessel ischemic type changes in the cerebral white matter to a mild/moderate degree. Vascular: Contemporaneous CTA. Skull and upper cervical spine: No evidence of marrow lesion. Sinuses/Orbits: Bilateral cataract resection. Sinusitis with moderate mucosal thickening in the ethmoids. IMPRESSION: 1. Moderate acute infarct in the left occipital cortex. Small acute and subacute infarcts in the right frontal parietal lobe. Suspect central origin in this patient with history of atrial fibrillation. 2. Small remote right parietal cortex infarct. 3. Chronic small vessel ischemia. Electronically Signed: By: Monte Fantasia M.D. On: 02/19/2018 13:08    Micro Results    No results found for this or any previous visit (from the past 240 hour(s)).     Today   Subjective:   [name] today has further headache, right peripheral vision loss  is improved.  No further neurological deficit and wants to go home. Objective:   Blood pressure (!) 159/99, pulse 67, temperature 98.2 F (36.8 C), temperature source Oral, resp. rate 16, height 6\' 2"  (1.88 m), weight (!) 170.2 kg, SpO2 92 %.  No intake or output data in the 24 hours ending 02/24/18 1426  Exam Awake Alert, Oriented x 3, No new F.N deficits, Normal affect [place].AT,PERRAL Supple Neck,No JVD, No cervical lymphadenopathy appriciated.  Symmetrical Chest wall movement, Good air movement bilaterally, CTAB RRR,No Gallops,Rubs or new Murmurs, No Parasternal Heave +ve B.Sounds, Abd Soft, Non tender, No organomegaly appriciated, No rebound -guarding or rigidity. No Cyanosis, Clubbing or edema, No new Rash or bruise  Data Review   CBC w Diff:  Lab Results  Component Value Date   WBC 9.8 02/18/2018   HGB 12.8 (L) 02/18/2018   HCT 37.8 (L) 02/18/2018   PLT 210 02/18/2018   LYMPHOPCT 23 02/18/2018   MONOPCT 9 02/18/2018   EOSPCT 5 02/18/2018   BASOPCT 1 02/18/2018    CMP:  Lab Results  Component Value Date   NA 139 02/18/2018   K 3.4 (L) 02/18/2018   CL 102 02/18/2018   CO2 27 02/18/2018   BUN 18 02/18/2018   CREATININE 1.15 02/18/2018  .  Total Time in preparing paper work, data evaluation and todays exam - 35 minutes  Epifanio Lesches M.D on 02/20/2018 at 2:26 PM    Note: This dictation was prepared with Dragon dictation along with smaller phrase technology. Any transcriptional errors that result from this process are unintentional.

## 2018-03-03 ENCOUNTER — Other Ambulatory Visit: Payer: Self-pay | Admitting: Family Medicine

## 2018-03-03 DIAGNOSIS — R599 Enlarged lymph nodes, unspecified: Secondary | ICD-10-CM

## 2018-03-03 NOTE — Consult Note (Signed)
[facility] Consult Services   Date of Service:   02/18/2018 22:05:09  Impression:     .  PCA Distribution  Comments: 57 yo with L PCA stroke. Evaluation completed outside tPA time window as time of evaluation is 4.5 hours since last normal. NIHSS 2 for L VF deficit. CT with age indeterminate R PCA stroke, and now new L PCA stroke symptoms. NIHSS 2, no NIR for isolated VF deficit with no weakness.  Mechanism of Stroke: Not Clear  Metrics: Last Known Well: 02/18/2018 17:45:00 [facility] Notification Time: 02/18/2018 25:95:63 Arrival Time: 02/18/2018 19:10:00 Stamp Time: 02/18/2018 22:05:09 Time First Login Attempt: 02/18/2018 22:08:00 Video Start Time: 02/18/2018 22:08:00  Symptoms: Visual changes NIHSS Start Assessment Time: 02/18/2018 22:11:00 Patient is not a candidate for tPA. Patient was not deemed candidate for tPA thrombolytics because of Last Well Known Above 4.5 Hours. Video End Time: 02/18/2018 22:20:00  CT head was reviewed.  Radiologist was not called back for review of advanced imaging because n/a ER Physician notified of the decision on thrombolytics management on 02/18/2018 22:46:00  Our recommendations are outlined below.  Recommendations:     .  Activate Stroke Protocol Admission/Order Set     .  Stroke/Telemetry Floor     .  Neuro Checks     .  Bedside Swallow Eval     .  DVT Prophylaxis     .  IV Fluids, Normal Saline     .  Head of Bed Below 30 Degrees     .  Euglycemia and Avoid Hyperthermia (PRN Acetaminophen)     .  Initiate Aspirin 325 MG Daily     .  Dysphagia screen, DVT prophylaxis  Routine Consultation with [facility] for Follow up Care  Sign Out:     .  Discussed with Emergency Department Provider    ------------------------------------------------------------------------------  History of Present Illness: Patient is a 57 year old Male.  Patient was brought by private transportation  with symptoms of Visual changes  57 yo male was driving home from work, and at about 1745 stopped to use the restroom at a gas station. He was walking out of the bathroom and had a sudden feeling of being disoriented, and noted vision changes to right. He states the last few minutes driving home were difficult. To ER for further evaluation later in the evening as vision symptoms persisted. He denies any weakness or speech changes.  CT head was reviewed.    Examination: 1A: Level of Consciousness - Alert; keenly responsive + 0 1B: Ask Month and Age - Both Questions Right + 0 1C: Blink Eyes & Squeeze Hands - Performs Both Tasks + 0 2: Test Horizontal Extraocular Movements - Normal + 0 3: Test Visual Fields - Complete Hemianopia + 2 4: Test Facial Palsy (Use Grimace if Obtunded) - Normal symmetry + 0 5A: Test Left Arm Motor Drift - No Drift for 10 Seconds + 0 5B: Test Right Arm Motor Drift - No Drift for 10 Seconds + 0 6A: Test Left Leg Motor Drift - No Drift for 5 Seconds + 0 6B: Test Right Leg Motor Drift - No Drift for 5 Seconds + 0 7: Test Limb Ataxia (FNF/Heel-Shin) - No Ataxia + 0 8: Test Sensation - Normal; No sensory loss + 0 9: Test Language/Aphasia - Normal; No aphasia + 0 10: Test Dysarthria - Normal + 0 11: Test Extinction/Inattention - No abnormality + 0  NIHSS Score: 2  Patient was informed the Neurology  Consult would happen via TeleHealth consult by way of interactive audio and video telecommunications and consented to receiving care in this manner.  Due to the immediate potential for life-threatening deterioration due to underlying acute neurologic illness, I spent 35 minutes providing critical care. This time includes time for face to face visit via telemedicine, review of medical records, imaging studies and discussion of findings with providers, the patient and/or family.   Dr Jene Every   [facility] [phone]

## 2018-03-23 ENCOUNTER — Ambulatory Visit: Payer: 59

## 2018-04-06 ENCOUNTER — Ambulatory Visit
Admission: RE | Admit: 2018-04-06 | Discharge: 2018-04-06 | Disposition: A | Discharge status: Home / Self Care | Payer: 59 | Source: Ambulatory Visit | Attending: Family Medicine | Admitting: Family Medicine

## 2018-04-06 DIAGNOSIS — R918 Other nonspecific abnormal finding of lung field: Secondary | ICD-10-CM | POA: Diagnosis not present

## 2018-04-06 DIAGNOSIS — R599 Enlarged lymph nodes, unspecified: Secondary | ICD-10-CM | POA: Insufficient documentation

## 2018-04-06 LAB — POCT I-STAT CREATININE: Creatinine, Ser: 1.1 mg/dL (ref 0.61–1.24)

## 2018-04-06 MED ORDER — IOHEXOL 300 MG/ML  SOLN
75.0000 mL | Freq: Once | INTRAMUSCULAR | Status: AC | PRN
  Administered 2018-04-06: 75 mL via INTRAVENOUS

## 2018-04-06 MED ORDER — IOPAMIDOL (ISOVUE-300) INJECTION 61%: 75.0000 mL | Freq: Once | INTRAVENOUS | Status: DC | PRN

## 2018-05-11 DIAGNOSIS — R809 Proteinuria, unspecified: Secondary | ICD-10-CM | POA: Diagnosis not present

## 2018-05-11 DIAGNOSIS — R600 Localized edema: Secondary | ICD-10-CM | POA: Diagnosis not present

## 2018-05-11 DIAGNOSIS — E118 Type 2 diabetes mellitus with unspecified complications: Secondary | ICD-10-CM | POA: Diagnosis not present

## 2018-05-18 DIAGNOSIS — I69398 Other sequelae of cerebral infarction: Secondary | ICD-10-CM | POA: Diagnosis not present

## 2018-05-18 DIAGNOSIS — I729 Aneurysm of unspecified site: Secondary | ICD-10-CM | POA: Diagnosis not present

## 2018-05-18 DIAGNOSIS — I6340 Cerebral infarction due to embolism of unspecified cerebral artery: Secondary | ICD-10-CM | POA: Diagnosis not present

## 2018-07-21 DIAGNOSIS — I6340 Cerebral infarction due to embolism of unspecified cerebral artery: Secondary | ICD-10-CM | POA: Diagnosis not present

## 2018-07-21 DIAGNOSIS — I10 Essential (primary) hypertension: Secondary | ICD-10-CM | POA: Diagnosis not present

## 2018-07-21 DIAGNOSIS — I4891 Unspecified atrial fibrillation: Secondary | ICD-10-CM | POA: Diagnosis not present

## 2018-08-03 DIAGNOSIS — I671 Cerebral aneurysm, nonruptured: Secondary | ICD-10-CM | POA: Diagnosis not present

## 2018-08-10 DIAGNOSIS — M1711 Unilateral primary osteoarthritis, right knee: Secondary | ICD-10-CM | POA: Diagnosis not present

## 2018-08-31 DIAGNOSIS — M1711 Unilateral primary osteoarthritis, right knee: Secondary | ICD-10-CM | POA: Diagnosis not present

## 2018-09-07 DIAGNOSIS — M1711 Unilateral primary osteoarthritis, right knee: Secondary | ICD-10-CM | POA: Diagnosis not present

## 2018-09-21 ENCOUNTER — Other Ambulatory Visit: Payer: Self-pay | Admitting: Family Medicine

## 2018-09-21 DIAGNOSIS — R590 Localized enlarged lymph nodes: Secondary | ICD-10-CM

## 2018-10-01 ENCOUNTER — Other Ambulatory Visit: Payer: Self-pay

## 2018-10-01 ENCOUNTER — Ambulatory Visit
Admission: RE | Admit: 2018-10-01 | Discharge: 2018-10-01 | Disposition: A | Discharge status: Home / Self Care | Payer: 59 | Source: Ambulatory Visit | Attending: Family Medicine | Admitting: Family Medicine

## 2018-10-01 DIAGNOSIS — R590 Localized enlarged lymph nodes: Secondary | ICD-10-CM | POA: Diagnosis not present

## 2018-10-01 LAB — POCT I-STAT CREATININE: Creatinine, Ser: 1.2 mg/dL (ref 0.61–1.24)

## 2018-10-01 MED ORDER — IOHEXOL 300 MG/ML  SOLN
75.0000 mL | Freq: Once | INTRAMUSCULAR | Status: AC | PRN
  Administered 2018-10-01: 75 mL via INTRAVENOUS

## 2018-10-14 ENCOUNTER — Other Ambulatory Visit (HOSPITAL_COMMUNITY): Payer: Self-pay | Admitting: Neurology

## 2018-10-14 ENCOUNTER — Other Ambulatory Visit: Payer: Self-pay | Admitting: Neurology

## 2018-10-14 DIAGNOSIS — I6340 Cerebral infarction due to embolism of unspecified cerebral artery: Secondary | ICD-10-CM

## 2018-10-21 ENCOUNTER — Ambulatory Visit
Admission: RE | Admit: 2018-10-21 | Discharge: 2018-10-21 | Disposition: A | Discharge status: Home / Self Care | Payer: 59 | Source: Ambulatory Visit | Attending: Neurology | Admitting: Neurology

## 2018-10-21 ENCOUNTER — Ambulatory Visit: Payer: 59

## 2018-10-21 ENCOUNTER — Other Ambulatory Visit: Payer: Self-pay

## 2018-10-21 DIAGNOSIS — I6340 Cerebral infarction due to embolism of unspecified cerebral artery: Secondary | ICD-10-CM | POA: Insufficient documentation

## 2018-10-21 MED ORDER — IOHEXOL 350 MG/ML SOLN
75.0000 mL | Freq: Once | INTRAVENOUS | Status: AC | PRN
  Administered 2018-10-21: 75 mL via INTRAVENOUS

## 2020-03-06 IMAGING — US US RENAL
1 series · 14 of 25 positions shown · non-contrast
Comparison: None.

CLINICAL DATA: Proteinuria

EXAM:
RENAL / URINARY TRACT ULTRASOUND COMPLETE

[Series 1: us renal · 0.30mm/px · 14 of 34 slices shown]
[im 1/34]
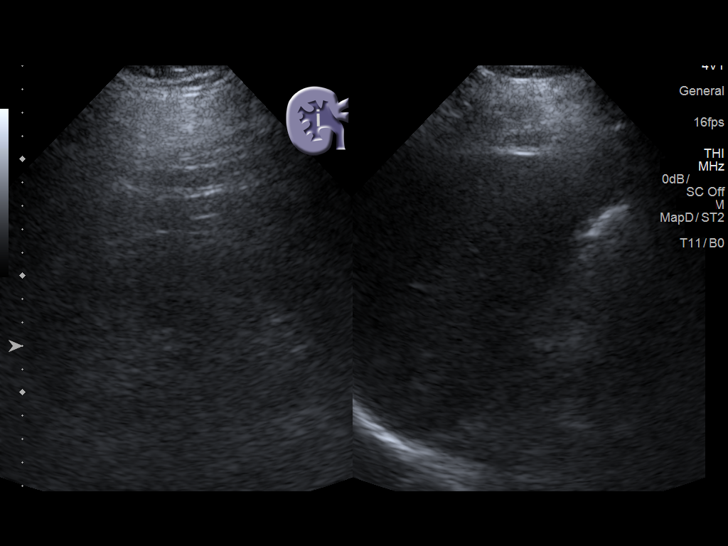
[im 3/34]
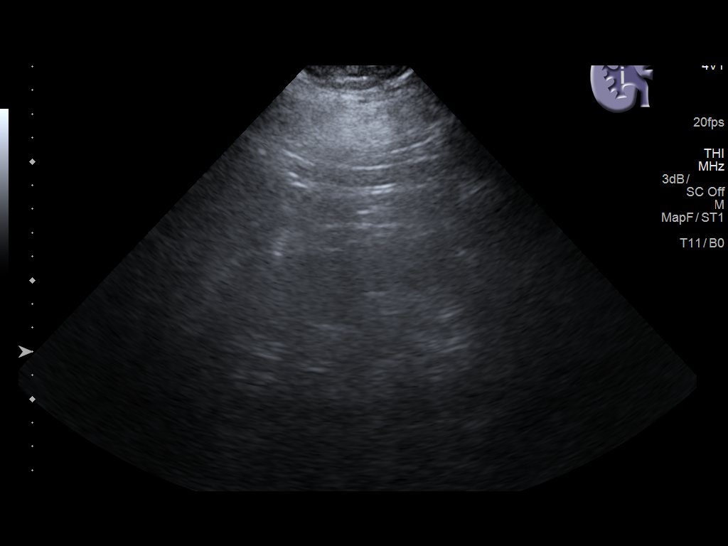
[im 6/34]
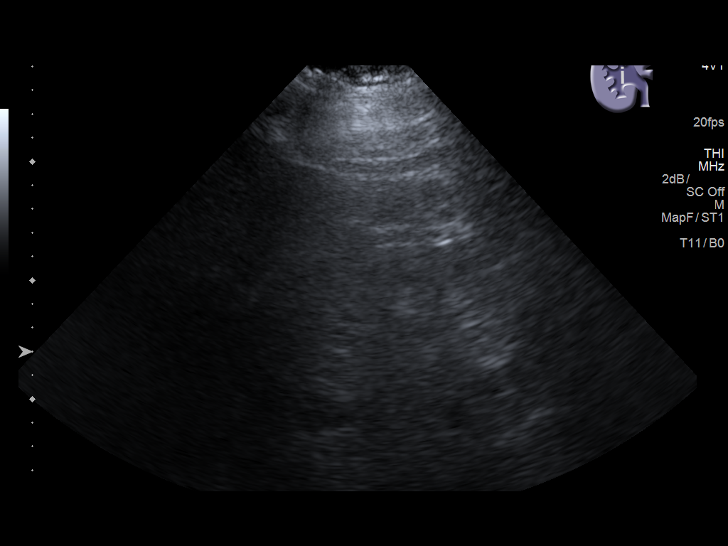
[im 9/34]
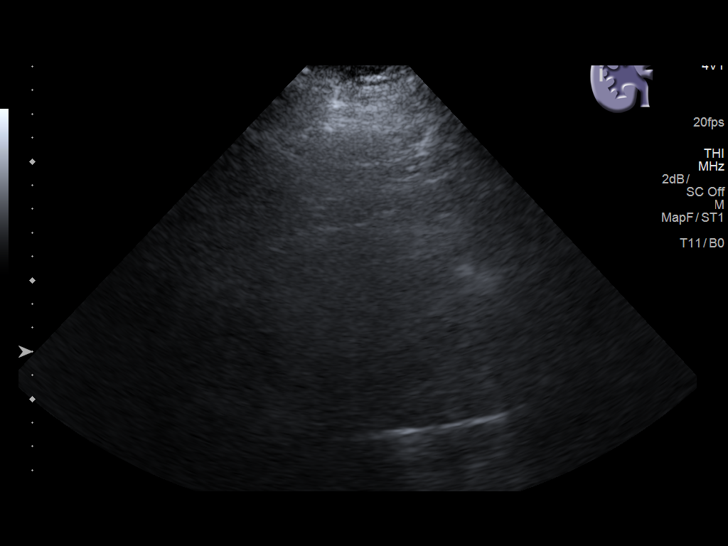
[im 12/34]
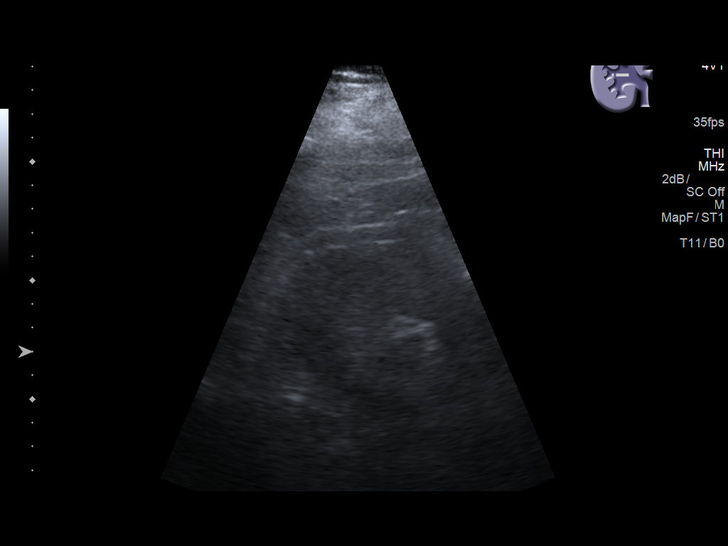
[im 13/34]
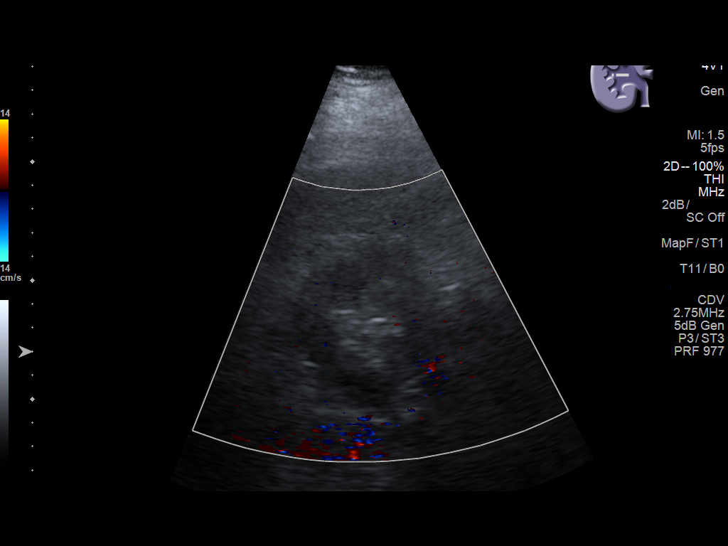
[im 16/34]
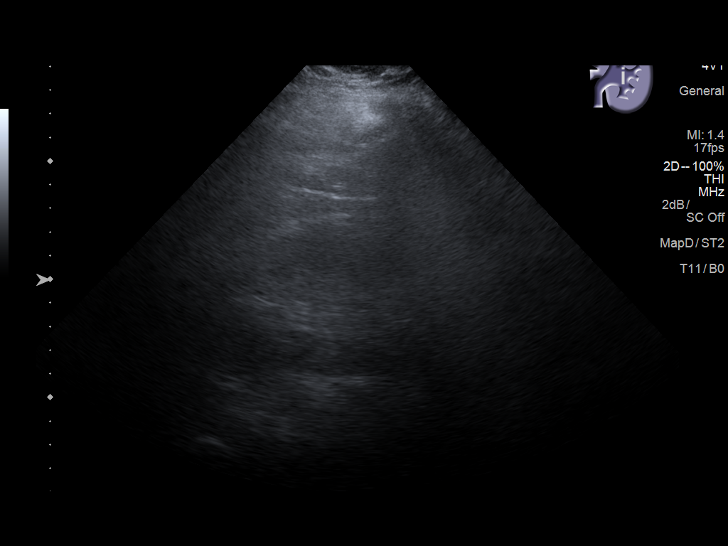
[im 18/34]
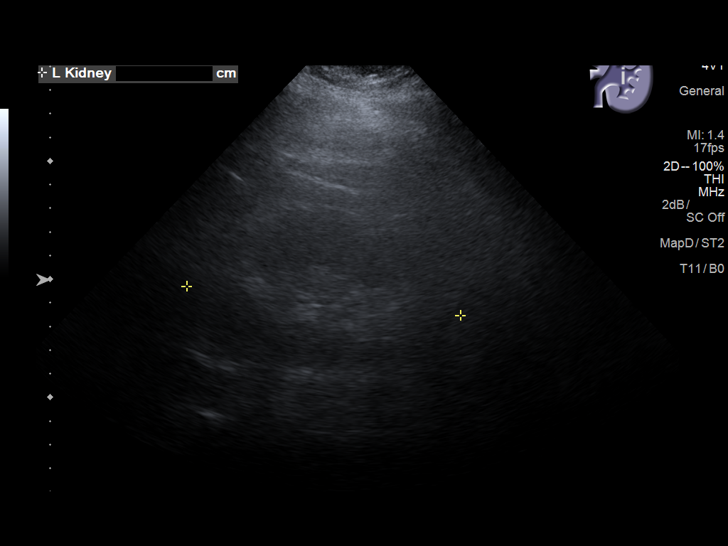
[im 21/34]
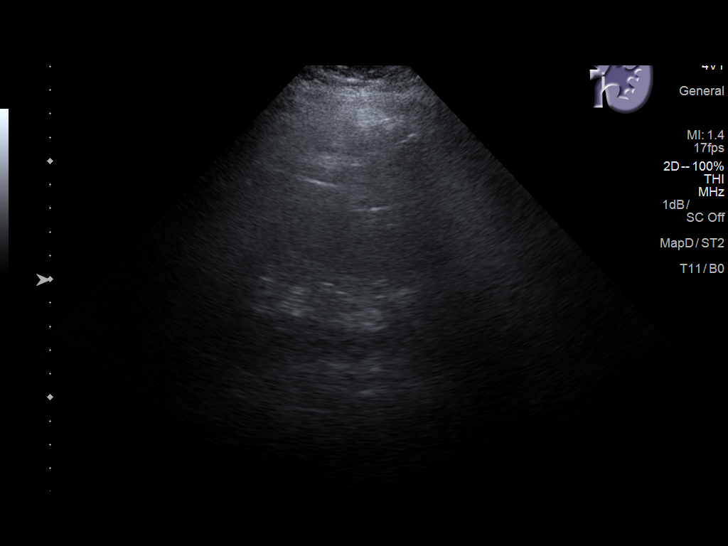
[im 23/34]
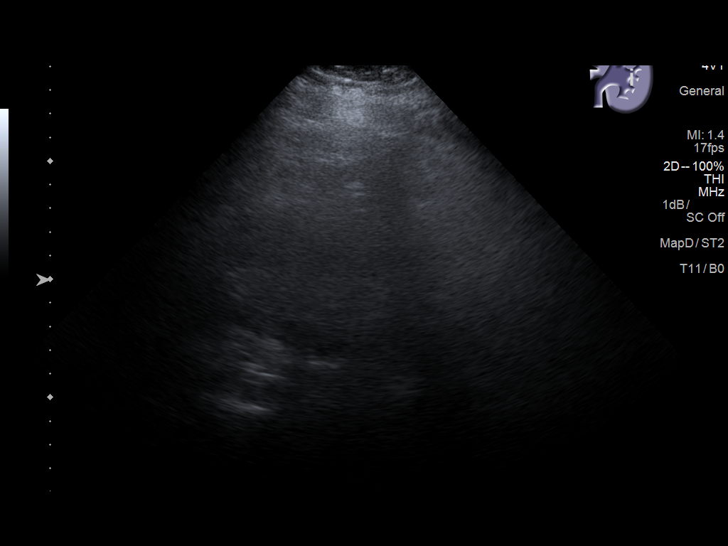
[im 25/34]
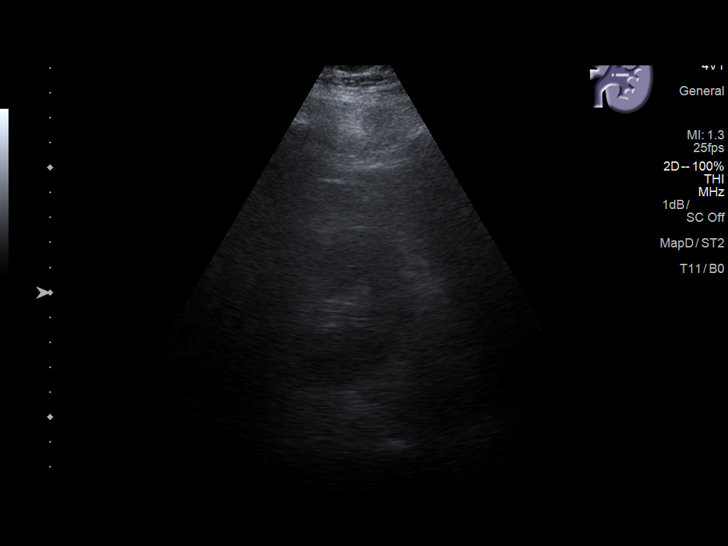
[im 28/34]
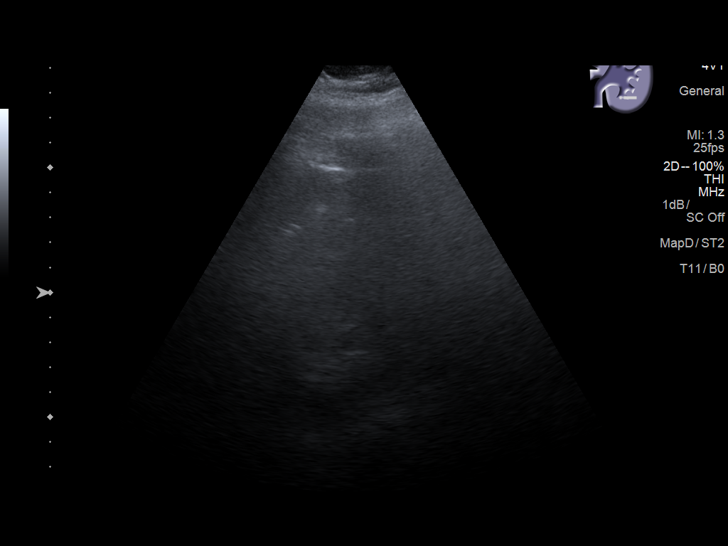
[im 31/34]
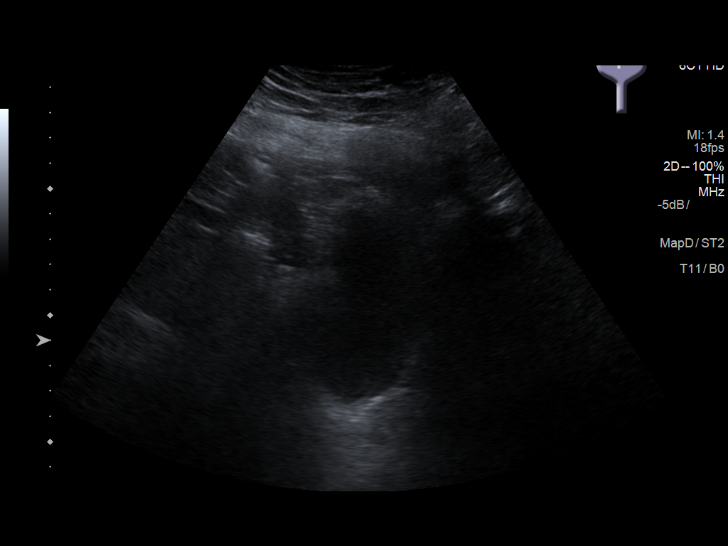
[im 34/34]
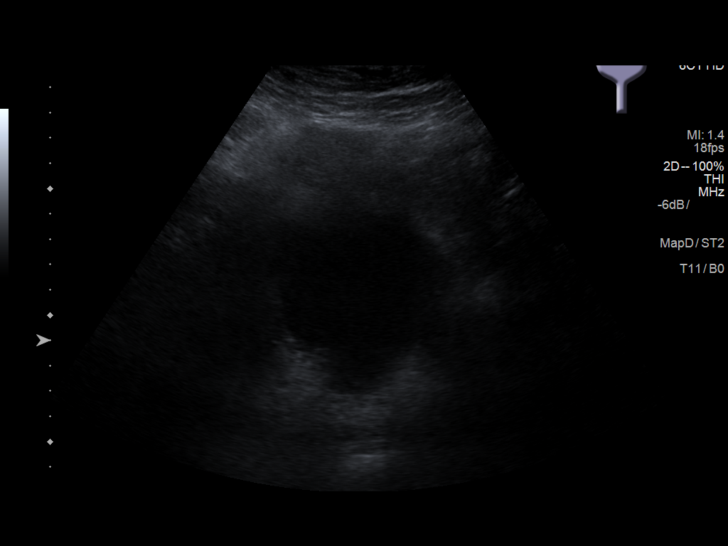

[14 of 25 positions shown; findings below may reference images not displayed]

FINDINGS: Right Kidney:

Length: 14.8 cm. Difficult visualization. No hydronephrosis or
obvious mass.

Left Kidney:

Length: 11.6 cm. Difficult visualization. No hydronephrosis or
obvious mass.

Bladder:

Appears normal for degree of bladder distention.
IMPRESSION: 1. Difficult/poor visualization of kidneys due to bowel gas and
habitus.
2. No hydronephrosis

## 2020-10-02 IMAGING — CT CT CHEST W/ CM
2 of 3 series · 15 of 36 positions shown, 18 images · IV contrast (omnipaque)
Comparison: Cervical spine CT 02/19/2018

CLINICAL DATA: Abnormal neck CT with mediastinal adenopathy.

EXAM:
CT CHEST WITH CONTRAST
TECHNIQUE: Multidetector CT imaging of the chest was performed during
intravenous contrast administration.
CONTRAST:  75mL OMNIPAQUE IOHEXOL 300 MG/ML  SOLN

[Series 2: axial st · axial · 0.77mm/px · z∈[-340,-72]mm · 12 of 158 slices shown, 15 images]
[im 12/158  mediastinal]
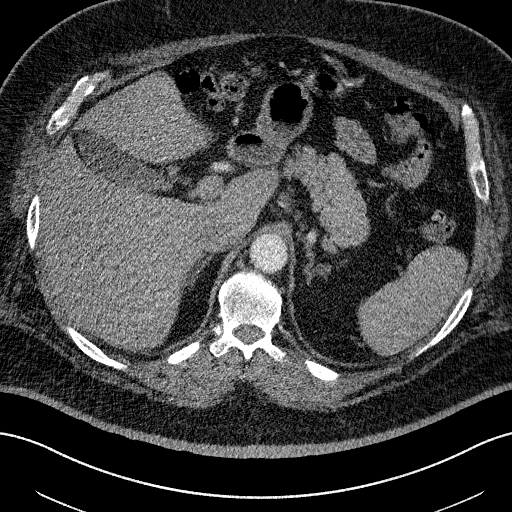
[im 12/158  lung]
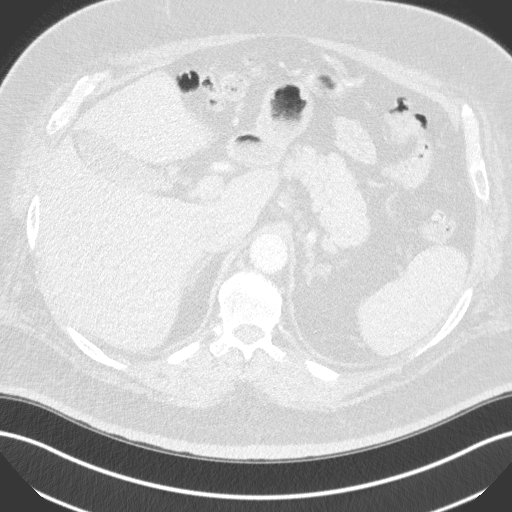
[im 24/158  lung]
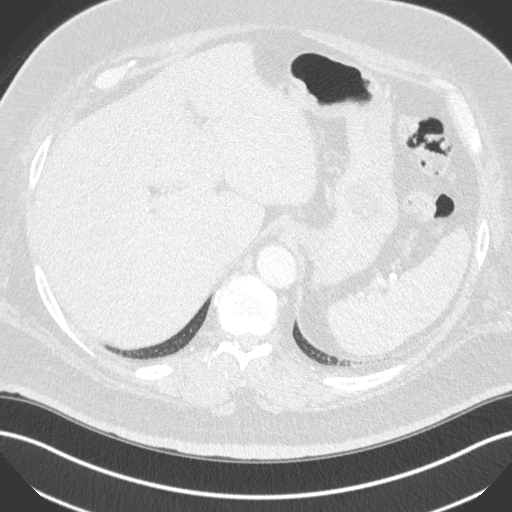
[im 35/158  lung]
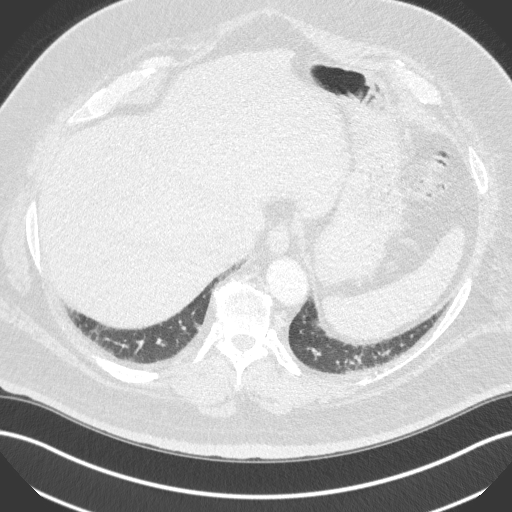
[im 47/158  lung]
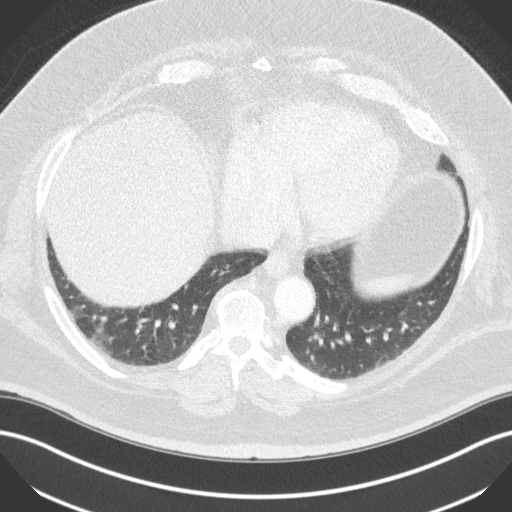
[im 59/158  mediastinal]
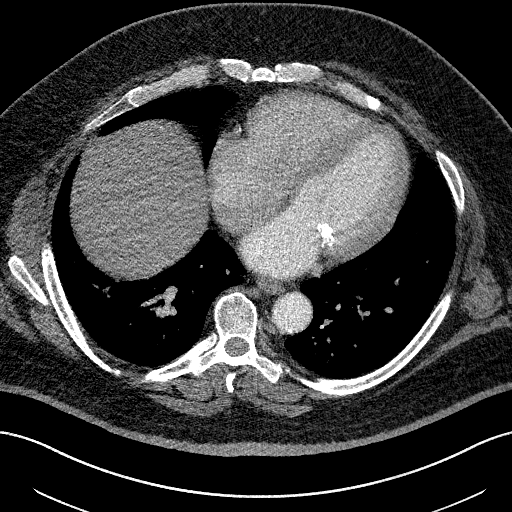
[im 59/158  lung]
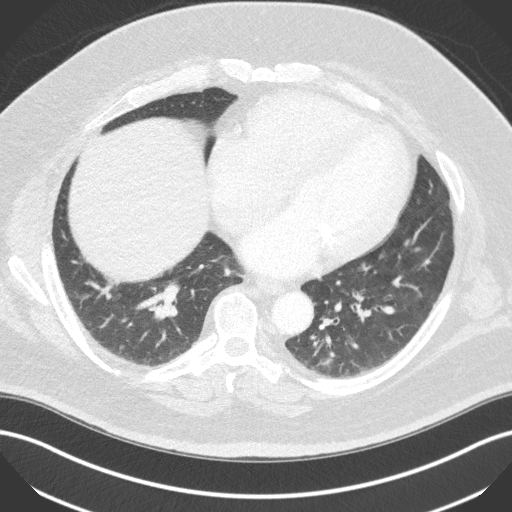
[im 70/158  lung]
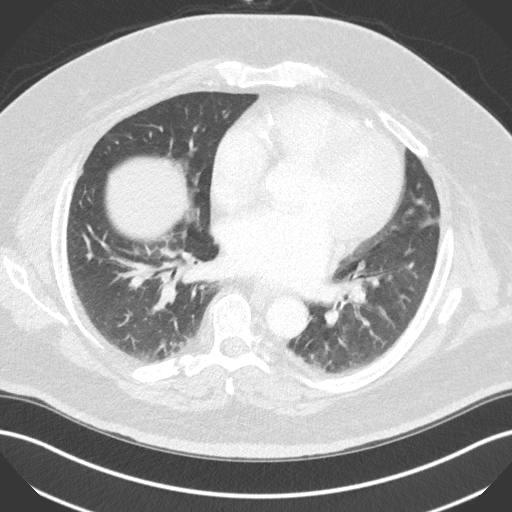
[im 88/158  lung]
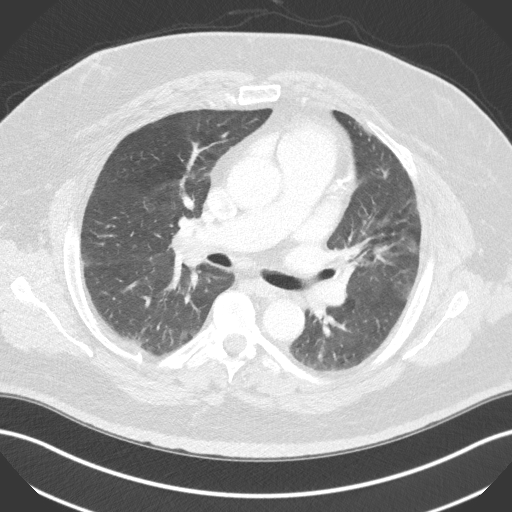
[im 99/158  lung]
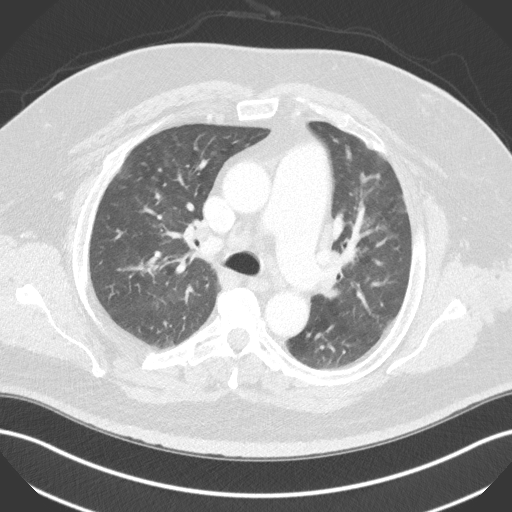
[im 111/158  mediastinal]
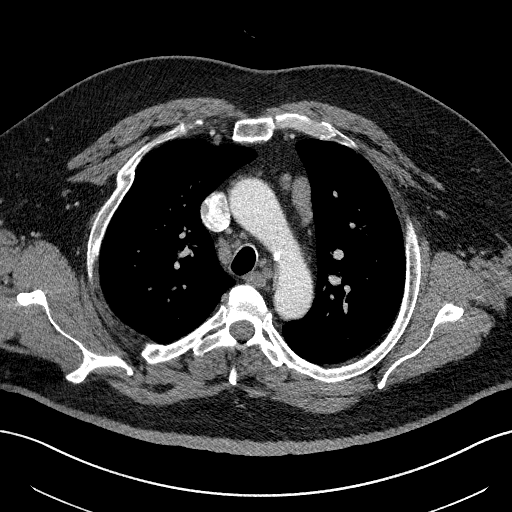
[im 111/158  lung]
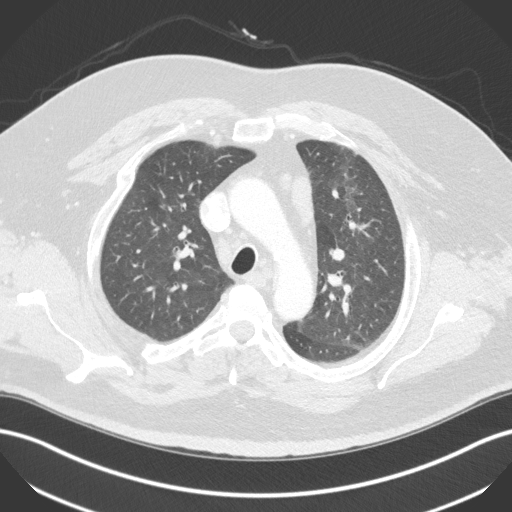
[im 123/158  lung]
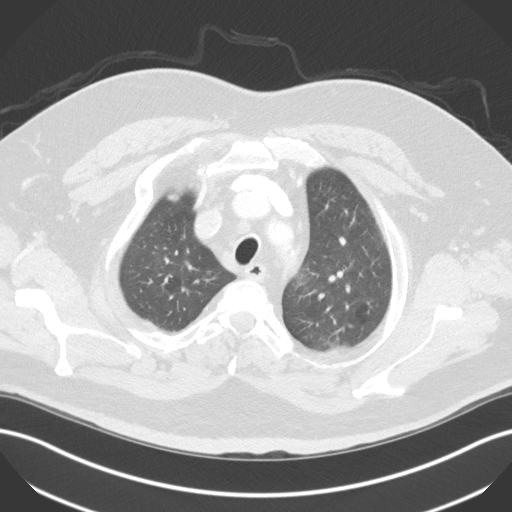
[im 134/158  lung]
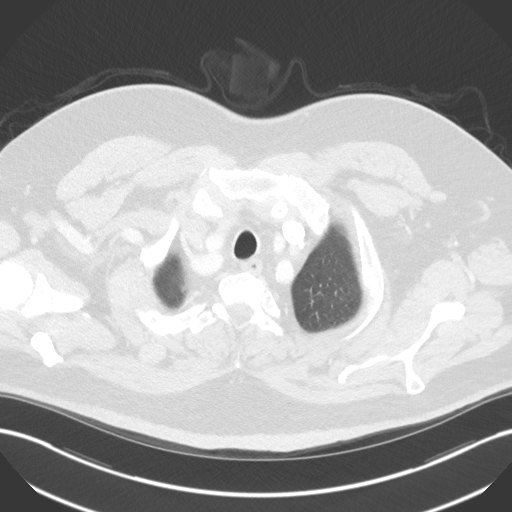
[im 146/158  lung]
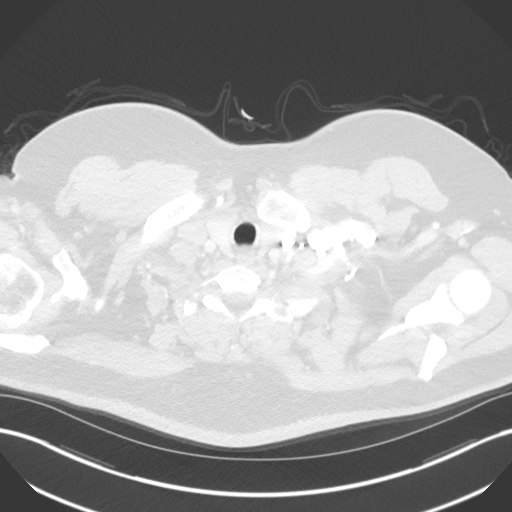

[Series 5: coronal · coronal · 0.65mm/px · 3 of 175 slices shown]
[im 35/175  lung]
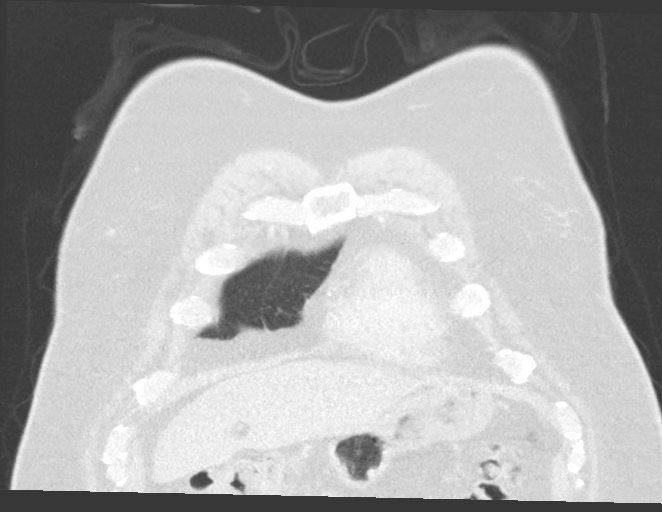
[im 70/175  lung]
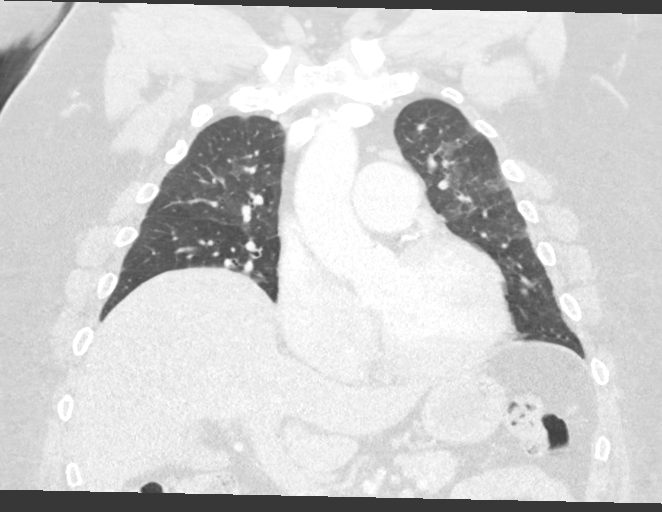
[im 105/175  lung]
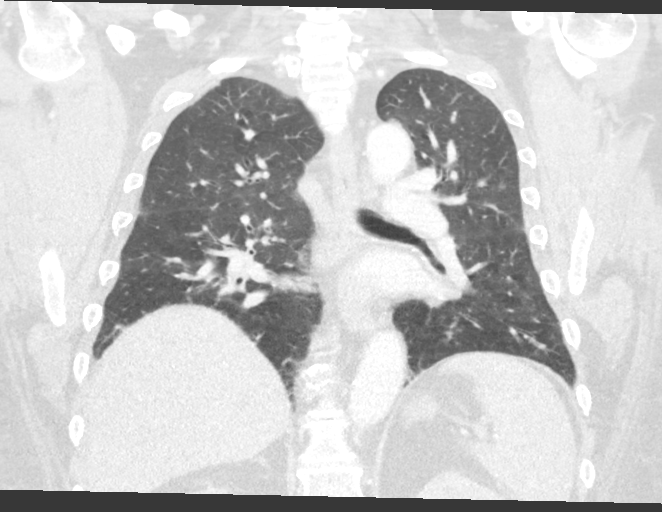

[15 of 36 positions shown; findings below may reference images not displayed]

FINDINGS: Cardiovascular: Diffuse coronary artery disease. Mild aortic
atherosclerosis. Heart is upper limits normal in size. Aorta is
normal caliber.

Mediastinum/Nodes: Enlarged mediastinal lymph nodes. Prevascular/AP
window lymph node measures 3.5 x 1.5 cm, stable. AP window lymph
node on image 54 has a short axis diameter of 14. Precarinal lymph
node has a short axis diameter of 12 mm. Other similarly sized or
smaller lymph nodes. No hilar or axillary adenopathy.

Lungs/Pleura: Scattered ground-glass opacities in both upper lobes,
improved since prior study. No confluent airspace opacities
otherwise. No effusions or suspicious pulmonary nodules.

Upper Abdomen: Imaging into the upper abdomen shows no acute
findings.

Musculoskeletal: Chest wall soft tissues are unremarkable. No acute
bony abnormality.
IMPRESSION: Continued mediastinal adenopathy, stable since prior neck CT.
Recommend continued surveillance with repeat chest CT in 3-6 months
or further characterisation with PET CT.

Diffuse coronary artery disease.

Scattered ground-glass opacities, most notable in the upper lobes,
improved since prior neck CT. This may reflect mild small airways
disease/alveolitis or areas of atelectasis.

Aortic Atherosclerosis (LUVSY-G11.1).

## 2023-12-15 NOTE — Progress Notes (Unsigned)
 12/16/23 10:40 AM   Erik [name] Sep 03, 1960 [id number]  CC: ED and elevated PSA   HPI: Initial visit today for evaluation of ED and an elevated PSA  Symptoms began: 2-5 years ago Primary issue: attaining and maintaining an erection Libido: reduced 30% Relationship w/ partner: good, married, wife has higher libido Rigidity: 30% SHIM: 5 No prior testosterone  labs. Walks a couple of miles daily, no active CV issues, angina.   Prior therapies:  Max dose PDE5i  PSA 3.9 (2022) PSA 2.6 (2021) PSA 5.5 (2020) PSA 2.45 (2019)  Hx of ED - failed sildenafil/tadalafil 20mg  PRN Hx of DM2 on Ozempic, A1c 5.9, hx of CVA in 2019, A-fib on metop, HTN   PMH: Past Medical History:  Diagnosis Date   Arthritis    Diabetes mellitus without complication (HCC)    Dysrhythmia    a-fib   Edema    FEET/LEGS   Edema    FEET/LEGS   History of orthopnea    Hypertension    Orthopnea    Palpitations    Palpitations    Sleep apnea     Surgical History: Past Surgical History:  Procedure Laterality Date   CATARACT EXTRACTION W/PHACO Left 09/10/2015   Procedure: CATARACT EXTRACTION PHACO AND INTRAOCULAR LENS PLACEMENT (IOC);  Surgeon: Steven Dingeldein, MD;  Location: [facility] ORS;  Service: Ophthalmology;  Laterality: Left;  US  00:51 AP% 15.8 CDE 16.04 fluid pack lot # [id number]   CATARACT EXTRACTION W/PHACO Right 10/01/2015   Procedure: CATARACT EXTRACTION PHACO AND INTRAOCULAR LENS PLACEMENT (IOC);  Surgeon: Steven Dingeldein, MD;  Location: [facility] ORS;  Service: Ophthalmology;  Laterality: Right;  US  51.3 AP% 18.2 CDE 18.28 Fluid pack lot # [id number]   COLONOSCOPY WITH PROPOFOL  N/A 10/26/2014   Procedure: COLONOSCOPY WITH PROPOFOL ;  Surgeon: Donnice Vaughn Manes, MD;  Location: [facility] ENDOSCOPY;  Service: Endoscopy;  Laterality: N/A;   HERNIA REPAIR      Family History: Family History  Problem Relation Age of Onset   Hypertension Mother    Diabetes Mother    Hyperlipidemia Mother     Heart attack Mother     Social History:  reports that he has quit smoking. He does not have any smokeless tobacco history on file. He reports that he does not drink alcohol and does not use drugs.  Physical Exam: BP (!) 174/90 (BP Location: Left Arm, Patient Position: Sitting, Cuff Size: Large)   Pulse 70   Ht 6' 2 (1.88 m)   Wt 260 lb 3.2 oz (118 kg)   SpO2 97%   BMI 33.41 kg/m    Constitutional:  Alert and oriented, No acute distress. Cardiovascular: No clubbing, cyanosis, or edema. Respiratory: Normal respiratory effort, no increased work of breathing. GI: Nondistended Skin: No rashes, bruises or suspicious lesions. Neurologic: Grossly intact, no focal deficits, moving all 4 extremities. Psychiatric: Normal mood and affect.  Laboratory Data: PSA 3.9 (2022) PSA 2.6 (2021) PSA 5.5 (2020) PSA 2.45 (2019)   Pertinent Imaging: N/A   Assessment & Plan:    ED (erectile dysfunction) of organic origin Assessment & Plan: Failed PDE5i, w/ some SEs  We reviewed the basic tenets of erectile dysfunction management, including normal erectile physiology, common contributing factors, and the spectrum of therapeutic options. Conservative measures such as lifestyle modification, optimization of comorbid conditions, and avoidance of exacerbating medications were discussed. Pharmacologic options including PDE5 inhibitors, intracavernosal or intraurethral therapies, and vacuum erection devices were reviewed, as well as surgical approaches such as penile prosthesis implantation. All  questions were answered.   - transition to ICI - will prescribe test dose Trimix to compounding pharmacy w/ option to order standard 5 vial supply. 30/10/1 at 0.2cc with titration to 0.8cc   - counseled on slightly higher risk of local hematoma on Eliquis  - basic workup today: CBC, testosterone  - Follow up with PA for ICI education, low T screen  Orders: -     CBC with Differential/Platelet; Future -      PSA; Future -     Testosterone ; Future -     NONFORMULARY OR COMPOUNDED ITEM; Trimix (30/1/10)-(Pap/Phent/PGE)  Test Dose  1ml vial start at 0.2cc dose  Qty #3 Refills 0  [facility] [phone] Fax [phone]  Dispense: 1 each; Refill: 0  Encounter for prostate cancer screening Assessment & Plan: Reviewed prior PSAs which have generally been within age-adjusted norms  Repeat routine screening PSA   Orders: -     PSA; Future      Penne Skye, MD 12/16/2023  [facility] [address] [place], [place] [address] [phone]

## 2023-12-16 ENCOUNTER — Ambulatory Visit: Admitting: Urology

## 2023-12-16 ENCOUNTER — Encounter: Payer: Self-pay | Admitting: Urology

## 2023-12-16 ENCOUNTER — Other Ambulatory Visit
Admission: RE | Admit: 2023-12-16 | Discharge: 2023-12-16 | Disposition: A | Discharge status: Home / Self Care | Attending: Urology | Admitting: Urology

## 2023-12-16 VITALS — BP 174/90 | HR 70 | Ht 74.0 in | Wt 260.2 lb

## 2023-12-16 DIAGNOSIS — Z125 Encounter for screening for malignant neoplasm of prostate: Secondary | ICD-10-CM | POA: Diagnosis present

## 2023-12-16 DIAGNOSIS — N529 Male erectile dysfunction, unspecified: Secondary | ICD-10-CM

## 2023-12-16 LAB — CBC WITH DIFFERENTIAL/PLATELET
Abs Immature Granulocytes: 0.01 K/uL (ref 0.00–0.07)
Basophils Absolute: 0.1 K/uL (ref 0.0–0.1)
Basophils Relative: 1 %
Eosinophils Absolute: 0.7 K/uL — ABNORMAL HIGH (ref 0.0–0.5)
Eosinophils Relative: 10 %
HCT: 30.3 % — ABNORMAL LOW (ref 39.0–52.0)
Hemoglobin: 10.3 g/dL — ABNORMAL LOW (ref 13.0–17.0)
Immature Granulocytes: 0 %
Lymphocytes Relative: 20 %
Lymphs Abs: 1.4 K/uL (ref 0.7–4.0)
MCH: 33.8 pg (ref 26.0–34.0)
MCHC: 34 g/dL (ref 30.0–36.0)
MCV: 99.3 fL (ref 80.0–100.0)
Monocytes Absolute: 0.7 K/uL (ref 0.1–1.0)
Monocytes Relative: 10 %
Neutro Abs: 4.1 K/uL (ref 1.7–7.7)
Neutrophils Relative %: 59 %
Platelets: 139 K/uL — ABNORMAL LOW (ref 150–400)
RBC: 3.05 MIL/uL — ABNORMAL LOW (ref 4.22–5.81)
RDW: 13.2 % (ref 11.5–15.5)
WBC: 7 K/uL (ref 4.0–10.5)
nRBC: 0 % (ref 0.0–0.2)

## 2023-12-16 LAB — PSA: Prostatic Specific Antigen: 5.75 ng/mL — ABNORMAL HIGH (ref 0.00–4.00)

## 2023-12-16 MED ORDER — NONFORMULARY OR COMPOUNDED ITEM: 0 refills | Status: AC

## 2023-12-16 NOTE — Assessment & Plan Note (Signed)
 Reviewed prior PSAs which have generally been within age-adjusted norms  Repeat routine screening PSA

## 2023-12-16 NOTE — Assessment & Plan Note (Addendum)
 Failed PDE5i, w/ some SEs  We reviewed the basic tenets of erectile dysfunction management, including normal erectile physiology, common contributing factors, and the spectrum of therapeutic options. Conservative measures such as lifestyle modification, optimization of comorbid conditions, and avoidance of exacerbating medications were discussed. Pharmacologic options including PDE5 inhibitors, intracavernosal or intraurethral therapies, and vacuum erection devices were reviewed, as well as surgical approaches such as penile prosthesis implantation. All questions were answered.   - transition to ICI - will prescribe test dose Trimix to compounding pharmacy w/ option to order standard 5 vial supply. 30/10/1 at 0.2cc with titration to 0.8cc   - counseled on slightly higher risk of local hematoma on Eliquis  - basic workup today: CBC, testosterone  - Follow up with PA for ICI education, low T screen

## 2023-12-16 NOTE — Patient Instructions (Addendum)
 Trimix Injection Training  The provider will prescribe the Trimix medication to [facility].  You will need to pick up the medication at:   [address]  [place], [place] [address] [phone]  Do not pick up your medication more than one week prior to your appointment.  The medication does have an expiration date and we cannot inject medication if it is expired.    You will need to have a normal blood pressure in order to be injected. Your blood pressure must be under 140/90. We will check your blood pressure multiple times to ensure the reading is correct, if you BP continues to be elevated we will need to reschedule your appointment. The cost for this medication is $84.11 for a single vial.

## 2023-12-17 LAB — TESTOSTERONE: Testosterone: 525 ng/dL (ref 264–916)

## 2023-12-18 ENCOUNTER — Ambulatory Visit: Payer: Self-pay | Admitting: Urology

## 2024-01-04 NOTE — Telephone Encounter (Signed)
 Called patient to inform him of his PSA results. Patient declined getting further lab testing done and he said he would like to get his PSA lab done with his primary care provider instead. I told him I would let Dr.Garren know to follow up once he got them done. Jenille Laszlo,CMA.

## 2024-01-24 NOTE — Progress Notes (Signed)
 01/26/2024 4:29 PM   Erik [name] [birth year] [id number]  Referring provider: Glover Lenis, MD [phone] Erik [name] [name],  [name] [id number]  Urological history: 1. Erectile dysfunction - testosterone  level (11/2023) 525 - failed PDE5i's   2. BPH with LU TS - PSA (11/2023) 5.75  Chief Complaint  Patient presents with   Erectile Dysfunction    Trimix teaching   HPI: Erik [name] is a 63 y.o. man who presents today for ICI titration.  Previous records reviewed.  SHIM 5  He has difficulty achieving and maintaining an erection.  Patient is not having spontaneous erections.  He denies any pain or curvature with erections.    Testosterone  level (11/2023) 525  Cholesterol (03/2023) 101  Hemoglobin A1c (11/2023) 5.9  Serum creatinine (11/2023) 1.8, eGFR 42  TSH (03/2023) 1.425   Tried and failed sildenafil and tadalafil   PMH: Past Medical History:  Diagnosis Date   Arthritis    Diabetes mellitus without complication (HCC)    Dysrhythmia    a-fib   Edema    FEET/LEGS   Edema    FEET/LEGS   History of orthopnea    Hypertension    Orthopnea    Palpitations    Palpitations    Sleep apnea     Surgical History: Past Surgical History:  Procedure Laterality Date   CATARACT EXTRACTION W/PHACO Left 09/10/2015   Procedure: CATARACT EXTRACTION PHACO AND INTRAOCULAR LENS PLACEMENT (IOC);  Surgeon: Erik Dingeldein, MD;  Location: [facility] ORS;  Service: Ophthalmology;  Laterality: Left;  US  00:51 AP% 15.8 CDE 16.04 fluid pack lot # [id number]   CATARACT EXTRACTION W/PHACO Right 10/01/2015   Procedure: CATARACT EXTRACTION PHACO AND INTRAOCULAR LENS PLACEMENT (IOC);  Surgeon: Erik Dingeldein, MD;  Location: [facility] ORS;  Service: Ophthalmology;  Laterality: Right;  US  51.3 AP% 18.2 CDE 18.28 Fluid pack lot # [id number]   COLONOSCOPY WITH PROPOFOL  N/A 10/26/2014   Procedure: COLONOSCOPY WITH PROPOFOL ;  Surgeon: Erik Vaughn Manes, MD;  Location: [facility]  ENDOSCOPY;  Service: Endoscopy;  Laterality: N/A;   HERNIA REPAIR      Home Medications:  Allergies as of 01/26/2024   No Known Allergies      Medication List        Accurate as of January 26, 2024 11:59 PM. If you have any questions, ask your nurse or doctor.          Accu-Chek Softclix Lancets lancets 2 (two) times daily.   amLODipine 5 MG tablet Commonly known as: NORVASC Take 5 mg by mouth daily.   apixaban  5 MG Tabs tablet Commonly known as: ELIQUIS  Take 5 mg by mouth 2 (two) times daily. What changed: Another medication with the same name was removed. Continue taking this medication, and follow the directions you see here. Changed by: Erik CORNWALL   aspirin  81 MG chewable tablet Chew 81 mg by mouth daily.   atorvastatin  40 MG tablet Commonly known as: LIPITOR Take 40 mg by mouth daily.   glucosamine-chondroitin 500-400 MG tablet Take 1 tablet by mouth 2 (two) times daily.   lisinopril-hydrochlorothiazide 20-12.5 MG tablet Commonly known as: ZESTORETIC Take 1 tablet by mouth daily.   metFORMIN 500 MG tablet Commonly known as: GLUCOPHAGE Take 1,000 mg by mouth 2 (two) times daily with a meal.   metoprolol  tartrate 100 MG tablet Commonly known as: LOPRESSOR  Take 50 mg by mouth 2 (two) times daily.   multivitamin with minerals tablet Take 1 tablet by mouth daily.   NONFORMULARY OR  COMPOUNDED ITEM Trimix (30/1/10)-(Pap/Phent/PGE)  Test Dose  1ml vial start at 0.2cc dose  Qty #3 Refills 0  [facility] [phone] Fax [phone]   Ozempic (2 MG/DOSE) 8 MG/3ML Sopn Generic drug: Semaglutide (2 MG/DOSE) INJECT 2 MG UNDER THE SKIN 1 TIME A WEEK   Precision QID Test test strip Generic drug: glucose blood by Other route.   topiramate 50 MG tablet Commonly known as: TOPAMAX Take 50 mg by mouth.        Allergies: No Known Allergies  Family History: Family History  Problem Relation Age of Onset   Hypertension Mother     Diabetes Mother    Hyperlipidemia Mother    Heart attack Mother     Social History:  reports that he has quit smoking. He does not have any smokeless tobacco history on file. He reports that he does not drink alcohol and does not use drugs.  ROS: Pertinent ROS in HPI  Physical Exam: BP (!) 157/88 (Cuff Size: Large)   Pulse 77   Ht 6' 2 (1.88 m)   Wt 250 lb (113.4 kg)   BMI 32.10 kg/m    Laboratory Data: See Epic and HPI I have reviewed the labs.   Pertinent Imaging: N/A  1. ED - patient could not be injected today as he had another appointment this morning, so we rescheduled ICI titration appointment   2. Elevated PSA - patient has declined any further follow up and wishes to defer all testing to his PCP  Return for ICI titration appointment .  These notes generated with voice recognition software. I apologize for typographical errors.  Erik [name]  [facility] [address]  [address] [place], [name] [address] [phone]

## 2024-01-26 ENCOUNTER — Encounter: Payer: Self-pay | Admitting: Urology

## 2024-01-26 ENCOUNTER — Ambulatory Visit: Admitting: Urology

## 2024-01-26 VITALS — BP 157/88 | HR 77 | Ht 74.0 in | Wt 250.0 lb

## 2024-01-26 DIAGNOSIS — R972 Elevated prostate specific antigen [PSA]: Secondary | ICD-10-CM

## 2024-01-26 DIAGNOSIS — N529 Male erectile dysfunction, unspecified: Secondary | ICD-10-CM

## 2024-02-09 NOTE — Progress Notes (Unsigned)
   02/11/2024 11:07 AM  Erik [name] 10-Feb-1961 [id number]   Referring provider: Glover Lenis, MD [phone] [address] [place],  [place] [address]  Urological history: 1. Erectile dysfunction - testosterone  level (11/2023) 525 - failed PDE5i's    2. BPH with LU TS - PSA (11/2023) 5.75  Chief Complaint  Patient presents with   Other    Trimix teaching    HPI: Erik [name] is a 63 y.o. male who presents today for ICI titration.    Previous records reviewed.     He presented for a titration on 01/26/2024, but he could not stay as he had another  appointment.    He states that he is able to achieve an erection, but he has a difficult time maintaining erection.  He has not had any pain with erections or curvature with erections.  He has failed PDE5i's.    Physical Exam:  BP (!) 150/74   Pulse 77   Ht 6' 2 (1.88 m)   Wt 250 lb (113.4 kg)   BMI 32.10 kg/m   Constitutional:  Well nourished. Alert and oriented, No acute distress. GU: No CVA tenderness.  No bladder fullness or masses.  Patient with uncircumcised phallus. Foreskin easily retracted  Urethral meatus is patent.  No penile discharge. No penile lesions or rashes.  Psychiatric: Normal mood and affect.   Procedure  Patient's left corpus cavernosum is identified.  An area near the base of the penis is cleansed with rubbing alcohol.  Careful to avoid the dorsal vein, 2 mcg of Trimix (papaverine 30 mg, phentolamine 1 mg and prostaglandin E1 10 mcg, Lot # [id number]$$[id number]iuCZKdWM$  exp # 02/28/2024 is injected at a 90 degree angle into the left corpus cavernosum near the base of the penis.  Patient experienced penile fullness in 15 minutes.    Patient's right corpus cavernosum is identified.  An area near the base of the penis is cleansed with rubbing alcohol.  Careful to avoid the dorsal vein,  2 mcg of Trimix (papaverine 30 mg, phentolamine 1 mg and prostaglandin E1 10 mcg, Lot # [id number]$$MzfnczAzqnmzIZPI_BUAoNhOMlgQexUDgKqeOZdwAdoZENpVW$  exp # 02/28/2024 is injected at a 90  degree angle into the right corpus cavernosum near the base of the penis.  Patient lost the erection.     Assessment & Plan:    1.  Erectile dysfunction - We discussed that going forward, I would like him to try to inject 0.6 at home to see if he can get a more substantial erection that would last long enough for intercourse, I also discussed using a constriction band to help keep the blood in the penis once it is erect - taught proper injection technique, including sterile handling, correct anatomical location and dosing - advised to use no more than once in 48-72 hours; instructed to seek care if erection persists beyond 4 hours  -He will let me know if the 0.6 of the Trimix (30/1/10) is effective by MyChart, if it is not effective, he would like to try a second titration with a higher potency Trimix   Return for Patient to call back with Trimix dose.  Clotilda Cornwall, PA-C   [facility] [address] [place], [place] [address] [phone]  Time Spent: Total time spent on the day of the encounter to include pre-visit record review, face-to-face time with the patient, and post-visit ordering of tests.  25 minutes.

## 2024-02-11 ENCOUNTER — Encounter: Payer: Self-pay | Admitting: Urology

## 2024-02-11 ENCOUNTER — Ambulatory Visit: Admitting: Urology

## 2024-02-11 VITALS — BP 150/74 | HR 77 | Ht 74.0 in | Wt 250.0 lb

## 2024-02-11 DIAGNOSIS — N529 Male erectile dysfunction, unspecified: Secondary | ICD-10-CM

## 2024-02-11 NOTE — Patient Instructions (Signed)
 TRIMIX SELF-INJECTION INSTRUCTIONS    DETAILED PROCEDURE  1. GETTING SET UP  A. Proper hygiene is important. Wash your hands and keep the penis clean.  B. Assemble the following:  - Bottle of Trimix  - Alcohol pad  - Syringe  C. Keep the Trimix cold by returning the bottle to the refrigerator, or by placing the bottle in a cup of ice.   2. PREPARE THE SYRINGE  A. Wipe the rubber top of the vial with an alcohol pad.  B. After removing the cap of the needle, pull the plunger back to the desired dosage, filling this volume with air. Use a new needle and syringe each time.  C. Insert the needle through the rubber top and inject the air into the vial.  D. Turn the vial with needle and syringe inserted upside down. Pull back on the syringe plunger in a slow and steady motion until the desired dosage is achieved which will be 0.6.  E. Tap the side of the syringe (1cc tuberculin syringe with a 29 gauge needle) to allow any air bubbles to float towards the needle. Avoid having these air bubbles in the syringe when self-injecting by first injecting out the collected bubbles that may form.  F. Remove the needle from the bottle and replace the protective cap on the needle.    3. SELECT AND PREPARE THE SITE FOR INJECTION  A. The proper location for injection is at the 9-11 and 1-3 o'clock positions, between the base and mid-portion of the penis.(see diagram) Avoid the midline because of potential for injury to the urethra (6 o'clock; for urinary passage) and the penile arteries and nerves (near 12 o'clock). Avoid any visible veins or arteries on the surface.  B. Grasp and pull the head of the penis toward the side of your leg with the index finger and thumb (use the left hand, if right handed). While maintaining light tension, select a site for injection.  C. Clean the site with an alcohol pad.   4: INJECT TRIMIX AND APPLY COMPRESSION  A. With a steady and continuous motion, penetrate the skin with the  needle at a 90 o angle. The needle should then be advanced to the hub. Slight resistance is encountered as the needle passes into the proper position within the erectile tissue (corporeal body).  B. Inject the Trimix over approximately 4 seconds. Withdraw the needle from the penis and apply compression to the injection site for approximately 1 minute. Several minutes of compression may be required to avoid bleeding, especially if you are an aspirin  user.  C. Replace the cap on the needle and dispose of properly.   If you experience a painful erection that will not go down, take four (30 mg) tablets of pseudoephedrine (Sudafed-not the extended release) and if the erection does not go down in the next hour or increases in pain, contact the office immediately or seek treatment in the ED

## 2024-05-18 ENCOUNTER — Inpatient Hospital Stay

## 2024-05-18 ENCOUNTER — Emergency Department

## 2024-05-18 ENCOUNTER — Inpatient Hospital Stay
Admission: EM | Admit: 2024-05-18 | Discharge: 2024-05-26 | DRG: 336 | Disposition: A | Attending: Internal Medicine | Admitting: Internal Medicine

## 2024-05-18 ENCOUNTER — Other Ambulatory Visit: Payer: Self-pay

## 2024-05-18 DIAGNOSIS — Z7984 Long term (current) use of oral hypoglycemic drugs: Secondary | ICD-10-CM

## 2024-05-18 DIAGNOSIS — E1122 Type 2 diabetes mellitus with diabetic chronic kidney disease: Secondary | ICD-10-CM | POA: Diagnosis present

## 2024-05-18 DIAGNOSIS — N179 Acute kidney failure, unspecified: Secondary | ICD-10-CM | POA: Diagnosis present

## 2024-05-18 DIAGNOSIS — D631 Anemia in chronic kidney disease: Secondary | ICD-10-CM | POA: Diagnosis present

## 2024-05-18 DIAGNOSIS — R109 Unspecified abdominal pain: Principal | ICD-10-CM

## 2024-05-18 DIAGNOSIS — Z7982 Long term (current) use of aspirin: Secondary | ICD-10-CM

## 2024-05-18 DIAGNOSIS — Z8673 Personal history of transient ischemic attack (TIA), and cerebral infarction without residual deficits: Secondary | ICD-10-CM

## 2024-05-18 DIAGNOSIS — I129 Hypertensive chronic kidney disease with stage 1 through stage 4 chronic kidney disease, or unspecified chronic kidney disease: Secondary | ICD-10-CM | POA: Diagnosis present

## 2024-05-18 DIAGNOSIS — K439 Ventral hernia without obstruction or gangrene: Secondary | ICD-10-CM | POA: Diagnosis present

## 2024-05-18 DIAGNOSIS — E876 Hypokalemia: Secondary | ICD-10-CM | POA: Diagnosis present

## 2024-05-18 DIAGNOSIS — I480 Paroxysmal atrial fibrillation: Secondary | ICD-10-CM | POA: Diagnosis present

## 2024-05-18 DIAGNOSIS — R112 Nausea with vomiting, unspecified: Secondary | ICD-10-CM | POA: Diagnosis not present

## 2024-05-18 DIAGNOSIS — Z7901 Long term (current) use of anticoagulants: Secondary | ICD-10-CM

## 2024-05-18 DIAGNOSIS — Z833 Family history of diabetes mellitus: Secondary | ICD-10-CM

## 2024-05-18 DIAGNOSIS — D539 Nutritional anemia, unspecified: Secondary | ICD-10-CM | POA: Diagnosis present

## 2024-05-18 DIAGNOSIS — Z87891 Personal history of nicotine dependence: Secondary | ICD-10-CM

## 2024-05-18 DIAGNOSIS — Z961 Presence of intraocular lens: Secondary | ICD-10-CM | POA: Diagnosis present

## 2024-05-18 DIAGNOSIS — Z83438 Family history of other disorder of lipoprotein metabolism and other lipidemia: Secondary | ICD-10-CM

## 2024-05-18 DIAGNOSIS — E785 Hyperlipidemia, unspecified: Secondary | ICD-10-CM | POA: Diagnosis present

## 2024-05-18 DIAGNOSIS — D509 Iron deficiency anemia, unspecified: Secondary | ICD-10-CM | POA: Diagnosis present

## 2024-05-18 DIAGNOSIS — K56609 Unspecified intestinal obstruction, unspecified as to partial versus complete obstruction: Secondary | ICD-10-CM | POA: Diagnosis not present

## 2024-05-18 DIAGNOSIS — Z8249 Family history of ischemic heart disease and other diseases of the circulatory system: Secondary | ICD-10-CM

## 2024-05-18 DIAGNOSIS — K5669 Other partial intestinal obstruction: Principal | ICD-10-CM | POA: Diagnosis present

## 2024-05-18 DIAGNOSIS — K660 Peritoneal adhesions (postprocedural) (postinfection): Secondary | ICD-10-CM | POA: Diagnosis present

## 2024-05-18 DIAGNOSIS — I10 Essential (primary) hypertension: Secondary | ICD-10-CM | POA: Diagnosis present

## 2024-05-18 DIAGNOSIS — Z9842 Cataract extraction status, left eye: Secondary | ICD-10-CM

## 2024-05-18 DIAGNOSIS — N183 Chronic kidney disease, stage 3 unspecified: Secondary | ICD-10-CM

## 2024-05-18 DIAGNOSIS — E119 Type 2 diabetes mellitus without complications: Secondary | ICD-10-CM

## 2024-05-18 DIAGNOSIS — N1832 Chronic kidney disease, stage 3b: Secondary | ICD-10-CM | POA: Diagnosis present

## 2024-05-18 DIAGNOSIS — Z79899 Other long term (current) drug therapy: Secondary | ICD-10-CM

## 2024-05-18 DIAGNOSIS — T300 Burn of unspecified body region, unspecified degree: Secondary | ICD-10-CM | POA: Insufficient documentation

## 2024-05-18 DIAGNOSIS — Z9841 Cataract extraction status, right eye: Secondary | ICD-10-CM

## 2024-05-18 LAB — COMPREHENSIVE METABOLIC PANEL WITH GFR
ALT: 18 U/L (ref 0–44)
AST: 27 U/L (ref 15–41)
Albumin: 4.3 g/dL (ref 3.5–5.0)
Alkaline Phosphatase: 82 U/L (ref 38–126)
Anion gap: 13 (ref 5–15)
BUN: 35 mg/dL — ABNORMAL HIGH (ref 8–23)
CO2: 23 mmol/L (ref 22–32)
Calcium: 10 mg/dL (ref 8.9–10.3)
Chloride: 103 mmol/L (ref 98–111)
Creatinine, Ser: 1.85 mg/dL — ABNORMAL HIGH (ref 0.61–1.24)
GFR, Estimated: 41 mL/min — ABNORMAL LOW
Glucose, Bld: 151 mg/dL — ABNORMAL HIGH (ref 70–99)
Potassium: 4.3 mmol/L (ref 3.5–5.1)
Sodium: 140 mmol/L (ref 135–145)
Total Bilirubin: 0.5 mg/dL (ref 0.0–1.2)
Total Protein: 7.9 g/dL (ref 6.5–8.1)

## 2024-05-18 LAB — CBC
HCT: 35 % — ABNORMAL LOW (ref 39.0–52.0)
Hemoglobin: 11.5 g/dL — ABNORMAL LOW (ref 13.0–17.0)
MCH: 33 pg (ref 26.0–34.0)
MCHC: 32.9 g/dL (ref 30.0–36.0)
MCV: 100.3 fL — ABNORMAL HIGH (ref 80.0–100.0)
Platelets: 195 10*3/uL (ref 150–400)
RBC: 3.49 MIL/uL — ABNORMAL LOW (ref 4.22–5.81)
RDW: 13.2 % (ref 11.5–15.5)
WBC: 11.7 10*3/uL — ABNORMAL HIGH (ref 4.0–10.5)
nRBC: 0 % (ref 0.0–0.2)

## 2024-05-18 LAB — URINALYSIS, ROUTINE W REFLEX MICROSCOPIC
Bacteria, UA: NONE SEEN
Bilirubin Urine: NEGATIVE
Glucose, UA: 50 mg/dL — AB
Hgb urine dipstick: NEGATIVE
Ketones, ur: 5 mg/dL — AB
Leukocytes,Ua: NEGATIVE
Nitrite: NEGATIVE
Protein, ur: >=300 mg/dL — AB
Specific Gravity, Urine: 1.017 (ref 1.005–1.030)
pH: 6 (ref 5.0–8.0)

## 2024-05-18 LAB — GLUCOSE, CAPILLARY
Glucose-Capillary: 128 mg/dL — ABNORMAL HIGH (ref 70–99)
Glucose-Capillary: 133 mg/dL — ABNORMAL HIGH (ref 70–99)

## 2024-05-18 LAB — LIPASE, BLOOD: Lipase: 66 U/L — ABNORMAL HIGH (ref 11–51)

## 2024-05-18 LAB — HEPARIN LEVEL (UNFRACTIONATED): Heparin Unfractionated: >1.1 [IU]/mL — ABNORMAL HIGH (ref 0.30–0.70)

## 2024-05-18 LAB — APTT
aPTT: 31 s (ref 24–36)
aPTT: 52 s — ABNORMAL HIGH (ref 24–36)

## 2024-05-18 MED ORDER — HEPARIN (PORCINE) 25000 UT/250ML-% IV SOLN
1850.0000 [IU]/h | INTRAVENOUS | Status: AC
  Administered 2024-05-18: 1500 [IU]/h via INTRAVENOUS
  Administered 2024-05-19: 1850 [IU]/h via INTRAVENOUS
  Administered 2024-05-19: 1650 [IU]/h via INTRAVENOUS
  Filled 2024-05-18 (×3): qty 250

## 2024-05-18 MED ORDER — IOHEXOL 300 MG/ML  SOLN
80.0000 mL | Freq: Once | INTRAMUSCULAR | Status: AC | PRN
  Administered 2024-05-18: 80 mL via INTRAVENOUS

## 2024-05-18 MED ORDER — MORPHINE SULFATE (PF) 4 MG/ML IV SOLN
4.0000 mg | Freq: Once | INTRAVENOUS | Status: AC
  Administered 2024-05-18: 4 mg via INTRAVENOUS
  Filled 2024-05-18: qty 1

## 2024-05-18 MED ORDER — ONDANSETRON HCL 4 MG PO TABS: 4.0000 mg | ORAL_TABLET | Freq: Four times a day (QID) | ORAL | Status: DC | PRN

## 2024-05-18 MED ORDER — METOPROLOL TARTRATE 5 MG/5ML IV SOLN
2.5000 mg | Freq: Once | INTRAVENOUS | Status: AC
  Administered 2024-05-18: 2.5 mg via INTRAVENOUS
  Filled 2024-05-18: qty 5

## 2024-05-18 MED ORDER — HEPARIN BOLUS VIA INFUSION
3000.0000 [IU] | Freq: Once | INTRAVENOUS | Status: AC
  Administered 2024-05-18: 3000 [IU] via INTRAVENOUS
  Filled 2024-05-18: qty 3000

## 2024-05-18 MED ORDER — HEPARIN SODIUM (PORCINE) 5000 UNIT/ML IJ SOLN: 5000.0000 [IU] | Freq: Three times a day (TID) | INTRAMUSCULAR | Status: DC

## 2024-05-18 MED ORDER — SODIUM CHLORIDE 0.9 % IV SOLN: INTRAVENOUS | Status: AC

## 2024-05-18 MED ORDER — INSULIN ASPART 100 UNIT/ML IJ SOLN: 0.0000 [IU] | Freq: Every day | INTRAMUSCULAR | Status: AC

## 2024-05-18 MED ORDER — INSULIN ASPART 100 UNIT/ML IJ SOLN
0.0000 [IU] | Freq: Three times a day (TID) | INTRAMUSCULAR | Status: DC
  Administered 2024-05-18 – 2024-05-22 (×4): 1 [IU] via SUBCUTANEOUS
  Administered 2024-05-23: 2 [IU] via SUBCUTANEOUS
  Administered 2024-05-24 – 2024-05-25 (×3): 1 [IU] via SUBCUTANEOUS
  Filled 2024-05-18: qty 1
  Filled 2024-05-18: qty 3
  Filled 2024-05-18 (×2): qty 1
  Filled 2024-05-18: qty 2
  Filled 2024-05-18: qty 1

## 2024-05-18 MED ORDER — ONDANSETRON HCL 4 MG/2ML IJ SOLN
4.0000 mg | INTRAMUSCULAR | Status: AC
  Administered 2024-05-18: 4 mg via INTRAVENOUS
  Filled 2024-05-18: qty 2

## 2024-05-18 MED ORDER — HYDRALAZINE HCL 20 MG/ML IJ SOLN
10.0000 mg | Freq: Four times a day (QID) | INTRAMUSCULAR | Status: DC | PRN
  Administered 2024-05-19: 10 mg via INTRAVENOUS
  Filled 2024-05-18: qty 1

## 2024-05-18 MED ORDER — SODIUM CHLORIDE 0.9 % IV BOLUS
500.0000 mL | Freq: Once | INTRAVENOUS | Status: AC
  Administered 2024-05-18: 500 mL via INTRAVENOUS

## 2024-05-18 MED ORDER — MORPHINE SULFATE (PF) 2 MG/ML IV SOLN
1.0000 mg | INTRAVENOUS | Status: DC | PRN
  Administered 2024-05-18 – 2024-05-19 (×4): 2 mg via INTRAVENOUS
  Filled 2024-05-18 (×4): qty 1

## 2024-05-18 MED ORDER — ONDANSETRON HCL 4 MG/2ML IJ SOLN
4.0000 mg | Freq: Four times a day (QID) | INTRAMUSCULAR | Status: DC | PRN
  Administered 2024-05-19 – 2024-05-23 (×9): 4 mg via INTRAVENOUS
  Filled 2024-05-18 (×9): qty 2

## 2024-05-18 NOTE — Progress Notes (Signed)
 IV access attempted x 3 to LFA. Failed. Vein rolls. IV access team consult.  Erik [name] V [name]

## 2024-05-18 NOTE — ED Notes (Signed)
 NG placement confirmed per Dr Creed, low continuous suction

## 2024-05-18 NOTE — Consult Note (Signed)
 PHARMACY - ANTICOAGULATION CONSULT NOTE  Pharmacy Consult for heparin  infusion Indication: atrial fibrillation  Patient Measurements: Height: 6' (182.9 cm) Weight: 108.9 kg (240 lb) IBW/kg (Calculated) : 77.6 HEPARIN  DW (KG): 100.6  Vital Signs: Temp: 98.2 F (36.8 C) (01/28 0548) Temp Source: Oral (01/28 0548) BP: 140/89 (01/28 0930) Pulse Rate: 86 (01/28 0930)  Labs: Recent Labs    05/18/24 0020 05/18/24 1436  HGB 11.5*  --   HCT 35.0*  --   PLT 195  --   APTT  --  31  HEPARINUNFRC  --  >1.10*  CREATININE 1.85*  --     Estimated Creatinine Clearance: 52.8 mL/min (A) (by C-G formula based on SCr of 1.85 mg/dL (H)).   Medical History: Past Medical History:  Diagnosis Date   Arthritis    Diabetes mellitus without complication (HCC)    Dysrhythmia    a-fib   Edema    FEET/LEGS   Edema    FEET/LEGS   History of orthopnea    Hypertension    Orthopnea    Palpitations    Palpitations    Sleep apnea     Medications:  Apixaban  PTA - last dose 1/27 in AM  Assessment: Patient admitted with abdominal pain and concern of small bowel obstruction. PMH includes a fib on Apixaban  PTA, hx of stroke in 2019, diabetes, hypertension, and sleep apnea.  Goal of Therapy:  Heparin  level 0.3-0.7 units/ml aPTT 66-102 seconds Monitor platelets by anticoagulation protocol: Yes   Plan:  NO BOLUS - per MD request. Last apixaban  dose 1/27 in AM Start heparin  infusion at 1500 units/hr Check aPTT in 6 hours and daily while on heparin  Continue to monitor H&H and platelets  Charlotte Brafford Rodriguez-Guzman PharmD, BCPS 05/18/2024 3:06 PM

## 2024-05-18 NOTE — Plan of Care (Signed)

## 2024-05-18 NOTE — ED Notes (Signed)
 Pt in CT.

## 2024-05-18 NOTE — H&P (Signed)
 " History and Physical    Patient: Erik [name] FMW:[id number] DOB: 01/06/1961 DOA: 05/18/2024 DOS: the patient was seen and examined on 05/18/2024 PCP: Glover Lenis, MD  Patient coming from: Home  Chief Complaint:  Chief Complaint  Patient presents with   Abdominal Pain   HPI: Erik [name] is a 64 y.o. male with medical history significant of a fib on oral anticoagulation, diabetes without complication, hypertension, sleep apnea comes to the emergency room with complaints of abdominal pain little over 24 hours which progressively worsening. He attempted to have a bowel movement thinking he'll feel better however did not seem to improve came to the emergency room.  Patient has had abdominal surgery positive for umbilical hernia repair in 1999 and subsequent ventral and umbilical hernia repair in 2004.  In the ER CT abdomen pelvis was done concerning for small bowel obstruction with transition at the site of mesh. NG tube was placed with significant amount of output. Patient is being admitted for further evaluation and management for SBO. Surgical consultation with with Dr. Marinda.      Review of Systems: As mentioned in the history of present illness. All other systems reviewed and are negative. Past Medical History:  Diagnosis Date   Arthritis    Diabetes mellitus without complication (HCC)    Dysrhythmia    a-fib   Edema    FEET/LEGS   Edema    FEET/LEGS   History of orthopnea    Hypertension    Orthopnea    Palpitations    Palpitations    Sleep apnea    Past Surgical History:  Procedure Laterality Date   CATARACT EXTRACTION W/PHACO Left 09/10/2015   Procedure: CATARACT EXTRACTION PHACO AND INTRAOCULAR LENS PLACEMENT (IOC);  Surgeon: Steven Dingeldein, MD;  Location: [facility] ORS;  Service: Ophthalmology;  Laterality: Left;  US  00:51 AP% 15.8 CDE 16.04 fluid pack lot # [id number]   CATARACT EXTRACTION W/PHACO Right 10/01/2015   Procedure: CATARACT EXTRACTION  PHACO AND INTRAOCULAR LENS PLACEMENT (IOC);  Surgeon: Steven Dingeldein, MD;  Location: [facility] ORS;  Service: Ophthalmology;  Laterality: Right;  US  51.3 AP% 18.2 CDE 18.28 Fluid pack lot # [id number]   COLONOSCOPY WITH PROPOFOL  N/A 10/26/2014   Procedure: COLONOSCOPY WITH PROPOFOL ;  Surgeon: Donnice Vaughn Manes, MD;  Location: [facility] ENDOSCOPY;  Service: Endoscopy;  Laterality: N/A;   HERNIA REPAIR     Social History:  reports that he has quit smoking. He does not have any smokeless tobacco history on file. He reports that he does not drink alcohol and does not use drugs.  Allergies[1]  Family History  Problem Relation Age of Onset   Hypertension Mother    Diabetes Mother    Hyperlipidemia Mother    Heart attack Mother     Prior to Admission medications  Medication Sig Start Date End Date Taking? Authorizing Provider  Accu-Chek Softclix Lancets lancets 2 (two) times daily. 12/17/23   [provider]  amLODipine (NORVASC) 5 MG tablet Take 5 mg by mouth daily. 09/10/23   [provider]  apixaban  (ELIQUIS ) 5 MG TABS tablet Take 5 mg by mouth 2 (two) times daily. 12/17/23   [provider]  aspirin  81 MG chewable tablet Chew 81 mg by mouth daily.    [provider]  atorvastatin  (LIPITOR) 40 MG tablet Take 40 mg by mouth daily. 02/11/18   [provider]  glucosamine-chondroitin 500-400 MG tablet Take 1 tablet by mouth 2 (two) times daily.     [provider]  lisinopril-hydrochlorothiazide (PRINZIDE,ZESTORETIC) 20-12.5 MG per tablet Take 1 tablet by mouth daily.    [provider]  metFORMIN (GLUCOPHAGE) 500 MG tablet Take 1,000 mg by mouth 2 (two) times daily with a meal.     [provider]  metoprolol  (LOPRESSOR ) 100 MG tablet Take 50 mg by mouth 2 (two) times daily.     [provider]  Multiple Vitamins-Minerals (MULTIVITAMIN WITH MINERALS) tablet Take 1 tablet by mouth daily.    [provider]   NONFORMULARY OR COMPOUNDED ITEM Trimix (30/1/10)-(Pap/Phent/PGE)  Test Dose  1ml vial start at 0.2cc dose  Qty #3 Refills 0  [facility] [phone] Fax [phone] 12/16/23   Georganne Penne SAUNDERS, MD  OZEMPIC, 2 MG/DOSE, 8 MG/3ML SOPN INJECT 2 MG UNDER THE SKIN 1 TIME A WEEK 06/25/23   [provider]  PRECISION QID TEST test strip by Other route. 12/17/23 12/16/24  [provider]  topiramate (TOPAMAX) 50 MG tablet Take 50 mg by mouth. 06/08/23   [provider]    Physical Exam: Vitals:   05/18/24 0019 05/18/24 0548 05/18/24 0700 05/18/24 0930  BP: (!) 179/108 (!) 151/99 (!) 156/90 (!) 140/89  Pulse: 85 85 81 86  Resp: 18 18  (!) 25  Temp: 98.2 F (36.8 C) 98.2 F (36.8 C)    TempSrc:  Oral    SpO2: 98% 97% 97% 94%  Weight:      Height:       Physical Exam Constitutional:      Appearance: He is well-developed.  HENT:     Head: Normocephalic and atraumatic.  Cardiovascular:     Rate and Rhythm: Normal rate and regular rhythm.  Pulmonary:     Effort: Pulmonary effort is normal.     Breath sounds: Normal breath sounds.  Abdominal:     General: Abdomen is protuberant. Bowel sounds are absent.     Palpations: Abdomen is soft.  Skin:    General: Skin is warm and dry.  Neurological:     General: No focal deficit present.     Mental Status: He is alert and oriented to person, place, and time.  Psychiatric:        Mood and Affect: Mood normal.        Behavior: Behavior normal.     Assessment and Plan: Erik [name] is a 64 y.o. male with medical history significant of a fib on oral anticoagulation, diabetes without complication, hypertension, sleep apnea comes to the emergency room with complaints of abdominal pain little over 24 hours which progressively worsening. He attempted to have a bowel movement thinking he'll feel better however did not seem to improve came to the emergency room.  CT Abdomen/Pelvis (05/18/2024) personally  reviewed with massive dilation of stomach, small bowel dilation with transition near previous mesh, no free air, no pneumatosis, and radiologist report reviewed below:  IMPRESSION: 1. Abnormal dilatation of the proximal and mid small bowel loops with multiple air-fluid levels, consistent with small bowel obstruction. Transition point is within the midline ventral abdomen where the small bowel loops approximate the midline ventral abdominal wall hernia mesh, most likely due to adhesions at the prior hernia repair site or a recurrent ventral hernia, and surgical consultation is recommended. 2. Small volume of free fluid within the abdomen and pelvis. No pneumoperitoneum.  SBO/ileus -- NPO -- NG with section -- IV fluids -- IV PRN pain meds -- serial KUB -- hold eliquis . Heparin  drip to be started -- surgical  consultation with Dr. Marinda  History of a fib on oral anticoagulation with eliquis  -- hold eliquis  in anticipation for possible surgery -- IV heparin  drip -- PRN IV metoprolol  for heart rate  Hyperlipidemia -- on statins will resume once PO allowed  Hypertension -- hold PO home meds. Will do IV PRN hydralazine     Advance Care Planning:   Code Status: Full Code per patient  Consults: neurosurgery  Family Communication: none at bedside  Severity of Illness: The appropriate patient status for this patient is INPATIENT. Inpatient status is judged to be reasonable and necessary in order to provide the required intensity of service to ensure the patient's safety. The patient's presenting symptoms, physical exam findings, and initial radiographic and laboratory data in the context of their chronic comorbidities is felt to place them at high risk for further clinical deterioration. Furthermore, it is not anticipated that the patient will be medically stable for discharge from the hospital within 2 midnights of admission.   * I certify that at the point of admission it is my  clinical judgment that the patient will require inpatient hospital care spanning beyond 2 midnights from the point of admission due to high intensity of service, high risk for further deterioration and high frequency of surveillance required.*  Author: Leita Blanch, MD 05/18/2024 2:38 PM  For on call review [email or web address].     [1] No Known Allergies  "

## 2024-05-18 NOTE — Consult Note (Addendum)
 [facility] SURGICAL CONSULTATION NOTE (initial) - cpt: 00746   HISTORY OF PRESENT ILLNESS (HPI):  64 y.o. male presented to [facility] ED today for evaluation of abdominal pain. Patient reports around a 24 hour history of abdominal pain which has been progressively worsening over this time. He described this as crampy in nature. This was accompanied by nausea and emesis as well. No fever, chills, CP, SOB, urinary changes. He did have a bowel movement yesterday but otherwise no flatus. He denied any history of previous known SBO in the past. Previous abdominal surgical history positive for umbilical hernia repair in 1999 and subsequent ventral and umbilical hernia repair in 2004. It does appear mesh was utilized. Of note, he has a history of atrial fibrillation on Eliquis , DM, HTN, HLD. Work up in the ED revealed a leukocytosis to 11.7K, Hgb to 11.5, AKI with sCr - 1.85. CT Abdomen/Pelvis was concerning for SBO with transition at site of mesh. NGT was placed in the ED.    Surgery is consulted by hospitalist physician Dr. Leita Blanch, MD in this context for evaluation and management of SBO.  PAST MEDICAL HISTORY (PMH):  Past Medical History:  Diagnosis Date   Arthritis    Diabetes mellitus without complication (HCC)    Dysrhythmia    a-fib   Edema    FEET/LEGS   Edema    FEET/LEGS   History of orthopnea    Hypertension    Orthopnea    Palpitations    Palpitations    Sleep apnea      PAST SURGICAL HISTORY (PSH):  Past Surgical History:  Procedure Laterality Date   CATARACT EXTRACTION W/PHACO Left 09/10/2015   Procedure: CATARACT EXTRACTION PHACO AND INTRAOCULAR LENS PLACEMENT (IOC);  Surgeon: Steven Dingeldein, MD;  Location: [facility] ORS;  Service: Ophthalmology;  Laterality: Left;  US  00:51 AP% 15.8 CDE 16.04 fluid pack lot # [id number] H   CATARACT EXTRACTION W/PHACO Right 10/01/2015   Procedure: CATARACT EXTRACTION PHACO AND INTRAOCULAR LENS PLACEMENT (IOC);  Surgeon: Steven  Dingeldein, MD;  Location: [facility] ORS;  Service: Ophthalmology;  Laterality: Right;  US  51.3 AP% 18.2 CDE 18.28 Fluid pack lot # [id number] H   COLONOSCOPY WITH PROPOFOL  N/A 10/26/2014   Procedure: COLONOSCOPY WITH PROPOFOL ;  Surgeon: Donnice Vaughn Manes, MD;  Location: [facility] ENDOSCOPY;  Service: Endoscopy;  Laterality: N/A;   HERNIA REPAIR       MEDICATIONS:  Prior to Admission medications  Medication Sig Start Date End Date Taking? Authorizing Provider  Accu-Chek Softclix Lancets lancets 2 (two) times daily. 12/17/23   [provider]  amLODipine (NORVASC) 5 MG tablet Take 5 mg by mouth daily. 09/10/23   [provider]  apixaban  (ELIQUIS ) 5 MG TABS tablet Take 5 mg by mouth 2 (two) times daily. 12/17/23   [provider]  aspirin  81 MG chewable tablet Chew 81 mg by mouth daily.    [provider]  atorvastatin  (LIPITOR) 40 MG tablet Take 40 mg by mouth daily. 02/11/18   [provider]  glucosamine-chondroitin 500-400 MG tablet Take 1 tablet by mouth 2 (two) times daily.     [provider]  lisinopril-hydrochlorothiazide (PRINZIDE,ZESTORETIC) 20-12.5 MG per tablet Take 1 tablet by mouth daily.    [provider]  metFORMIN (GLUCOPHAGE) 500 MG tablet Take 1,000 mg by mouth 2 (two) times daily with a meal.     [provider]  metoprolol  (LOPRESSOR ) 100 MG tablet Take 50 mg by mouth 2 (two) times daily.  [provider]  Multiple Vitamins-Minerals (MULTIVITAMIN WITH MINERALS) tablet Take 1 tablet by mouth daily.    [provider]  NONFORMULARY OR COMPOUNDED ITEM Trimix (30/1/10)-(Pap/Phent/PGE)  Test Dose  1ml vial start at 0.2cc dose  Qty #3 Refills 0  [facility] [phone] Fax [phone] 12/16/23   Georganne Penne SAUNDERS, MD  OZEMPIC, 2 MG/DOSE, 8 MG/3ML SOPN INJECT 2 MG UNDER THE SKIN 1 TIME A WEEK 06/25/23   [provider]  PRECISION QID TEST test strip by Other route. 12/17/23  12/16/24  [provider]  topiramate (TOPAMAX) 50 MG tablet Take 50 mg by mouth. 06/08/23   [provider]     ALLERGIES:  Allergies[1]   SOCIAL HISTORY:  Social History   Socioeconomic History   Marital status: Married    Spouse name: Not on file   Number of children: Not on file   Years of education: Not on file   Highest education level: Not on file  Occupational History   Not on file  Tobacco Use   Smoking status: Former   Smokeless tobacco: Not on file  Substance and Sexual Activity   Alcohol use: No    Alcohol/week: 0.0 standard drinks of alcohol    Comment: Reports has abstained from alcohol for past 15 months.   Drug use: No    Comment: Reports has been drug free for 15 months.   Sexual activity: Yes    Birth control/protection: None  Other Topics Concern   Not on file  Social History Narrative   Not on file   Social Drivers of Health   Tobacco Use: Medium Risk (05/18/2024)   Patient History    Smoking Tobacco Use: Former    Smokeless Tobacco Use: Unknown    Passive Exposure: Not on file  Financial Resource Strain: Low Risk  (12/08/2023)   Received from [facility]   Overall Financial Resource Strain (CARDIA)    Difficulty of Paying Living Expenses: Not hard at all  Food Insecurity: No Food Insecurity (12/08/2023)   Received from [facility]   Epic    Within the past 12 months, you worried that your food would run out before you got the money to buy more.: Never true    Within the past 12 months, the food you bought just didn't last and you didn't have money to get more.: Never true  Transportation Needs: No Transportation Needs (12/08/2023)   Received from [facility] - Transportation    In the past 12 months, has lack of transportation kept you from medical appointments or from getting medications?: No    Lack of Transportation (Non-Medical): No  Physical Activity: Not on  file  Stress: Not on file  Social Connections: Not on file  Intimate Partner Violence: Not on file  Depression (EYV7-0): Not on file  Alcohol Screen: Not on file  Housing: Low Risk  (12/08/2023)   Received from [facility]   Epic    In the last 12 months, was there a time when you were not able to pay the mortgage or rent on time?: No    In the past 12 months, how many times have you moved where you were living?: 0    At any time in the past 12 months, were you homeless or living in a shelter (including now)?: No  Utilities: Not At Risk (12/08/2023)   Received from [facility]  Epic    In the past 12 months has the electric, gas, oil, or water company threatened to shut off services in your home?: No  Health Literacy: Not on file     FAMILY HISTORY:  Family History  Problem Relation Age of Onset   Hypertension Mother    Diabetes Mother    Hyperlipidemia Mother    Heart attack Mother       REVIEW OF SYSTEMS:  Review of Systems  Constitutional:  Negative for chills and fever.  Respiratory:  Negative for cough and shortness of breath.   Cardiovascular:  Negative for chest pain and palpitations.  Gastrointestinal:  Positive for abdominal pain, nausea and vomiting. Negative for constipation and diarrhea.  Genitourinary:  Negative for dysuria and urgency.  All other systems reviewed and are negative.   VITAL SIGNS:  Temp:  [98.2 F (36.8 C)] 98.2 F (36.8 C) (01/28 0548) Pulse Rate:  [81-85] 81 (01/28 0700) Resp:  [18] 18 (01/28 0548) BP: (151-179)/(90-108) 156/90 (01/28 0700) SpO2:  [97 %-98 %] 97 % (01/28 0700) Weight:  [108.9 kg] 108.9 kg (01/28 0018)     Height: 6' (182.9 cm) Weight: 108.9 kg BMI (Calculated): 32.54   INTAKE/OUTPUT:  No intake/output data recorded.  PHYSICAL EXAM:  Physical Exam Vitals and nursing note reviewed. Exam conducted with a chaperone present.  Constitutional:      General: He is not in acute  distress.    Appearance: He is well-developed. He is not ill-appearing.     Comments: Resting in bed; NAD  HENT:     Head: Normocephalic and atraumatic.     Comments: NGT in place; significant bilious output Eyes:     General: No scleral icterus.    Extraocular Movements: Extraocular movements intact.  Cardiovascular:     Rate and Rhythm: Normal rate. Rhythm irregularly irregular.     Heart sounds: Normal heart sounds. No murmur heard.    Comments: Atrial fibrillation on monitor  Pulmonary:     Effort: Pulmonary effort is normal. No respiratory distress.  Abdominal:     General: A surgical scar is present. There is no distension.     Palpations: Abdomen is soft.     Tenderness: There is abdominal tenderness in the periumbilical area. There is no guarding or rebound.     Comments: Abdomen is soft, central tenderness, no rebound/guarding   Genitourinary:    Comments: Deferred Skin:    General: Skin is warm and dry.  Neurological:     General: No focal deficit present.     Mental Status: He is alert and oriented to person, place, and time.      Labs:     Latest Ref Rng & Units 05/18/2024   12:20 AM 12/16/2023    4:32 PM 02/18/2018    9:58 PM  CBC  WBC 4.0 - 10.5 K/uL 11.7  7.0  9.8   Hemoglobin 13.0 - 17.0 g/dL 88.4  89.6  87.1   Hematocrit 39.0 - 52.0 % 35.0  30.3  37.8   Platelets 150 - 400 K/uL 195  139  210       Latest Ref Rng & Units 05/18/2024   12:20 AM 10/01/2018    2:07 PM 04/06/2018    8:05 AM  CMP  Glucose 70 - 99 mg/dL 848     BUN 8 - 23 mg/dL 35     Creatinine 9.38 - 1.24 mg/dL 8.14  8.79  8.89   Sodium 135 - 145 mmol/L  140     Potassium 3.5 - 5.1 mmol/L 4.3     Chloride 98 - 111 mmol/L 103     CO2 22 - 32 mmol/L 23     Calcium  8.9 - 10.3 mg/dL 89.9     Total Protein 6.5 - 8.1 g/dL 7.9     Total Bilirubin 0.0 - 1.2 mg/dL 0.5     Alkaline Phos 38 - 126 U/L 82     AST 15 - 41 U/L 27     ALT 0 - 44 U/L 18        Imaging studies:   CT  Abdomen/Pelvis (05/18/2024) personally reviewed with massive dilation of stomach, small bowel dilation with transition near previous mesh, no free air, no pneumatosis, and radiologist report reviewed below:  IMPRESSION: 1. Abnormal dilatation of the proximal and mid small bowel loops with multiple air-fluid levels, consistent with small bowel obstruction. Transition point is within the midline ventral abdomen where the small bowel loops approximate the midline ventral abdominal wall hernia mesh, most likely due to adhesions at the prior hernia repair site or a recurrent ventral hernia, and surgical consultation is recommended. 2. Small volume of free fluid within the abdomen and pelvis. No pneumoperitoneum.   Assessment/Plan: (ICD-10's: K55.609) 64 y.o. 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   - Appreciate medicine admission - Agree with NGT decompression; continuous suction for now and then transition to LIS; monitor and record output  - No need for emergent surgical intervention. We will give 24-48 hours for clinical improvement. He understands if he fails to resolve, he will require surgical intervention this admission which will likely require mesh excision and likely small bowel resection  - Continue NPO; Will need IVF resuscitation given AKI - Monitor abdominal examination; on-going bowel function    - Serial KUB in the AM tomorrow; Likely do gastrografin  tomorrow - Pain control prn; antiemetics prn   - Hold Eliquis ; Heparin  gtt should be reasonable alternative if needed as we can stop this should he require surgery   - Okay to mobilize  - Further management per primary service; we will follow   All of the above findings and recommendations were discussed with the patient, and all of patient's questions were answered to his expressed  satisfaction.  Thank you for the opportunity to participate in this patient's care.   -- Arthea Platt, PA-C [facility] 05/18/2024, 9:32 AM M-F: 7am - 4pm     [1] No Known Allergies

## 2024-05-18 NOTE — ED Provider Notes (Signed)
 CT with SBO.  Patient still with some mild nausea, soft and minimal tenderness on exam.  I consulted medicine who agrees to admit.  Asked nurse to place NG tube   Claudene Rover, MD 05/18/24 [phone]

## 2024-05-18 NOTE — ED Provider Notes (Signed)
 "  [facility] Provider Note    Event Date/Time   First MD Initiated Contact with Patient 05/18/24 [phone]     (approximate)   History   Abdominal Pain   HPI  Erik [name] is a 64 y.o. male history of hypertension stroke and diabetes  Yesterday started having a crampy abdominal pain in his mid abdomen, slowly worsening throughout the day.  Then seemingly progressing more so with increasing waves of crampy pain.  Ultimately leading to vomiting several times nonbloody emesis.  Also reports urgency to defecate, had a very small bowel movement.  In the past he had a infection in the abdomen that he is not sure what it was many years ago he was hospitalized in [place] .  Otherwise had a ventral surgery on a hernia that was repaired and he seen no changes there pain in the area of the hernia  No alcohol use.  No chest pain.  No fevers or chills no pain or burning with urination no flank or back pain.  Patient reports he is never had this type of symptoms or pain before.  He is a little concerned he could have a blockage or something given the waves of increasing pain in the small bowel movement.  Does not currently have an urge to defecate.  Able to pass some gas earlier  Reviewed external records from primary care Dr. Glover from August.  Diabetes.  Ozempic and metformin.  Hypertension treated, hyperlipidemia, previous left occipital infarct.  Atrial fibrillation aspirin  and Eliquis  use  Past Medical History:  Diagnosis Date   Arthritis    Diabetes mellitus without complication (HCC)    Dysrhythmia    a-fib   Edema    FEET/LEGS   Edema    FEET/LEGS   History of orthopnea    Hypertension    Orthopnea    Palpitations    Palpitations    Sleep apnea      Physical Exam   Triage Vital Signs: ED Triage Vitals  Encounter Vitals Group     BP 05/18/24 0019 (!) 179/108     Girls Systolic BP Percentile --      Girls Diastolic BP Percentile --      Boys  Systolic BP Percentile --      Boys Diastolic BP Percentile --      Pulse Rate 05/18/24 0019 85     Resp 05/18/24 0019 18     Temp 05/18/24 0019 98.2 F (36.8 C)     Temp Source 05/18/24 0548 Oral     SpO2 05/18/24 0019 98 %     Weight 05/18/24 0018 240 lb (108.9 kg)     Height 05/18/24 0018 6' (1.829 m)     Head Circumference --      Peak Flow --      Pain Score 05/18/24 0018 6     Pain Loc --      Pain Education --      Exclude from Growth Chart --     Most recent vital signs: Vitals:   05/18/24 0019 05/18/24 0548  BP: (!) 179/108 (!) 151/99  Pulse: 85 85  Resp: 18 18  Temp: 98.2 F (36.8 C) 98.2 F (36.8 C)  SpO2: 98% 97%   No noted fever.  Mild hypertension  General: Awake, no distress.  CV:   Good peripheral perfusion. Normal rate and heart tones. Resp:   Normal effort. Lung sounds clear bilateral. Speaking without distress. Abd:   No  distention.  Soft nontender in the upper abdomen bilateral.  Moderate tenderness to palpation in the lower quadrants bilaterally, slightly more so on the right than the left.  No frank peritonitis.  He does have ventral hernia repair scar but no obvious incarceration or mass.  No pain along the groins or masses.  No rebound or guarding.  Patient reports no testicular or penile type symptoms Neuro:   No focal neuro deficits noted. Moves extremities well without noted concern. Other:  This pleasant appears in some pain he reports the pain is slightly subsided but still having pain mostly in the mid abdomen   ED Results / Procedures / Treatments   Labs (all labs ordered are listed, but only abnormal results are displayed) Labs Reviewed  LIPASE, BLOOD - Abnormal; Notable for the following components:      Result Value   Lipase 66 (*)    All other components within normal limits  COMPREHENSIVE METABOLIC PANEL WITH GFR - Abnormal; Notable for the following components:   Glucose, Bld 151 (*)    BUN 35 (*)    Creatinine, Ser 1.85 (*)     GFR, Estimated 41 (*)    All other components within normal limits  CBC - Abnormal; Notable for the following components:   WBC 11.7 (*)    RBC 3.49 (*)    Hemoglobin 11.5 (*)    HCT 35.0 (*)    MCV 100.3 (*)    All other components within normal limits  URINALYSIS, ROUTINE W REFLEX MICROSCOPIC - Abnormal; Notable for the following components:   Color, Urine YELLOW (*)    APPearance CLEAR (*)    Glucose, UA 50 (*)    Ketones, ur 5 (*)    Protein, ur >=300 (*)    All other components within normal limits   Labs notable for slightly elevated white count slight anemia with elevated MCV.  Also have slight elevation of lipase but only minimal.  Creatinine 1.85-though notes in [facility] indicate previous GFR of 42 in August, today's GFR approximately his baseline  Urinalysis without overt evidence of infection.  Proteinuria is present.  EKG  Denies associated chest pain but given the waves of discomfort EKG performed to exclude any obvious cardiac cause though I most likely seems to be abdominal in nature  I independently reviewed the EKG at 710 heart rate 85 QRS 100 QTc 470.  Atrial fibrillation.  No frank ischemic abnormalities.  Slight artifact    PROCEDURES:  Critical Care performed: No  Procedures   MEDICATIONS ORDERED IN ED: Medications  ondansetron  (ZOFRAN ) injection 4 mg (has no administration in time range)  morphine  (PF) 4 MG/ML injection 4 mg (has no administration in time range)  metoprolol  tartrate (LOPRESSOR ) injection 2.5 mg (has no administration in time range)  sodium chloride  0.9 % bolus 500 mL (has no administration in time range)     IMPRESSION / MDM / ASSESSMENT AND PLAN / ED COURSE  I reviewed the triage vital signs and the nursing notes.                              Based on presentation, the differential diagnosis includes, but is not limited to key considerations: abdominal pain DIFFERENTIAL DIAGNOSIS CONSIDERATIONS: High-acuity etiologies considered  and prioritized for rule-out: - Abdominal Aortic Aneurysm (AAA) Rupture/Leak - Mesenteric Ischemia - Perforated Viscus (Hollow Organ Perforation) - Acute Appendicitis - Testicular Torsion (if male) - Bowel Obstruction (  Strangulated) - Ascending Cholangitis - Acute Pancreatitis - Cholecystitis - Diverticulitis   Based on the patient's symptoms lower to mid abdominal pain slightly more in the right increasing waves of pain think CT imaging is needed.  Do wish to exclude many of the above concerns.  No history that sounds frankly like a pain out of proportion to exam vascular type concern.  He does have very minimally elevated lipase but in the setting of vomiting I do not think this necessarily represents acute pancreatitis denies history of pancreatic issues in the past.  He is in no acute distress nontoxic.  Will give small dose of metoprolol  as he does have A-fib and he reports he has not been able to keep his medication down.  Pain medication antiemetic etc.  Patient's presentation is most consistent with acute presentation with potential threat to life or bodily function.    The patient is on the cardiac monitor to evaluate for evidence of arrhythmia and/or significant heart rate changes.    Ongoing care with plan for followup pending labs, CT, and reassement to Dr. Claudene  FINAL CLINICAL IMPRESSION(S) / ED DIAGNOSES   Final diagnoses:  Abdominal pain, unspecified abdominal location  Nausea and vomiting, unspecified vomiting type     Rx / DC Orders   ED Discharge Orders     None        Note:  This document was prepared using Dragon voice recognition software and may include unintentional dictation errors.   Dicky Anes, MD 05/18/24 [phone]  "

## 2024-05-18 NOTE — ED Triage Notes (Signed)
 Pt reports abd pain that began earlier today with n/v. Pt denies constipation or diarrhea. Denies dysuria.

## 2024-05-19 ENCOUNTER — Inpatient Hospital Stay

## 2024-05-19 DIAGNOSIS — I480 Paroxysmal atrial fibrillation: Secondary | ICD-10-CM

## 2024-05-19 DIAGNOSIS — R112 Nausea with vomiting, unspecified: Secondary | ICD-10-CM | POA: Diagnosis not present

## 2024-05-19 DIAGNOSIS — I10 Essential (primary) hypertension: Secondary | ICD-10-CM | POA: Diagnosis not present

## 2024-05-19 DIAGNOSIS — E119 Type 2 diabetes mellitus without complications: Secondary | ICD-10-CM | POA: Diagnosis not present

## 2024-05-19 DIAGNOSIS — K56609 Unspecified intestinal obstruction, unspecified as to partial versus complete obstruction: Secondary | ICD-10-CM | POA: Diagnosis not present

## 2024-05-19 DIAGNOSIS — R109 Unspecified abdominal pain: Secondary | ICD-10-CM | POA: Diagnosis not present

## 2024-05-19 DIAGNOSIS — N183 Chronic kidney disease, stage 3 unspecified: Secondary | ICD-10-CM

## 2024-05-19 LAB — HEMOGLOBIN A1C
Hgb A1c MFr Bld: 6.2 % — ABNORMAL HIGH (ref 4.8–5.6)
Mean Plasma Glucose: 131.24 mg/dL

## 2024-05-19 LAB — CBC
HCT: 34.3 % — ABNORMAL LOW (ref 39.0–52.0)
Hemoglobin: 11.2 g/dL — ABNORMAL LOW (ref 13.0–17.0)
MCH: 32.9 pg (ref 26.0–34.0)
MCHC: 32.7 g/dL (ref 30.0–36.0)
MCV: 100.9 fL — ABNORMAL HIGH (ref 80.0–100.0)
Platelets: 160 10*3/uL (ref 150–400)
RBC: 3.4 MIL/uL — ABNORMAL LOW (ref 4.22–5.81)
RDW: 13.3 % (ref 11.5–15.5)
WBC: 7.3 10*3/uL (ref 4.0–10.5)
nRBC: 0 % (ref 0.0–0.2)

## 2024-05-19 LAB — MISC LABCORP TEST (SEND OUT): Labcorp test code: 83935

## 2024-05-19 LAB — GLUCOSE, CAPILLARY
Glucose-Capillary: 108 mg/dL — ABNORMAL HIGH (ref 70–99)
Glucose-Capillary: 118 mg/dL — ABNORMAL HIGH (ref 70–99)
Glucose-Capillary: 124 mg/dL — ABNORMAL HIGH (ref 70–99)
Glucose-Capillary: 124 mg/dL — ABNORMAL HIGH (ref 70–99)

## 2024-05-19 LAB — APTT
aPTT: 111 s — ABNORMAL HIGH (ref 24–36)
aPTT: 68 s — ABNORMAL HIGH (ref 24–36)
aPTT: 72 s — ABNORMAL HIGH (ref 24–36)

## 2024-05-19 LAB — HEPARIN LEVEL (UNFRACTIONATED): Heparin Unfractionated: >1.1 [IU]/mL — ABNORMAL HIGH (ref 0.30–0.70)

## 2024-05-19 MED ORDER — DIATRIZOATE MEGLUMINE & SODIUM 66-10 % PO SOLN
90.0000 mL | Freq: Once | ORAL | Status: AC
  Administered 2024-05-19: 90 mL via NASOGASTRIC

## 2024-05-19 MED ORDER — METOPROLOL TARTRATE 5 MG/5ML IV SOLN
10.0000 mg | Freq: Three times a day (TID) | INTRAVENOUS | Status: DC
  Administered 2024-05-19 – 2024-05-20 (×4): 10 mg via INTRAVENOUS
  Filled 2024-05-19 (×4): qty 10

## 2024-05-19 MED ORDER — MORPHINE SULFATE (PF) 2 MG/ML IV SOLN
1.0000 mg | INTRAVENOUS | Status: AC | PRN
  Administered 2024-05-19 – 2024-05-25 (×35): 2 mg via INTRAVENOUS
  Filled 2024-05-19 (×36): qty 1

## 2024-05-19 MED ORDER — LACTATED RINGERS IV SOLN: INTRAVENOUS | Status: AC

## 2024-05-19 NOTE — Consult Note (Signed)
 PHARMACY - ANTICOAGULATION CONSULT NOTE  Pharmacy Consult for heparin  infusion Indication: atrial fibrillation  Patient Measurements: Height: 6' (182.9 cm) Weight: 108.9 kg (240 lb) IBW/kg (Calculated) : 77.6 HEPARIN  DW (KG): 100.6  Vital Signs: Temp: 98.9 F (37.2 C) (01/29 1946) Temp Source: Oral (01/29 1946) BP: 166/98 (01/29 1946) Pulse Rate: 97 (01/29 1946)  Labs: Recent Labs    05/18/24 0020 05/18/24 1436 05/18/24 2232 05/19/24 0616 05/19/24 1348 05/19/24 1938  HGB 11.5*  --   --   --  11.2*  --   HCT 35.0*  --   --   --  34.3*  --   PLT 195  --   --   --  160  --   APTT  --  31   < > 111* 68* 72*  HEPARINUNFRC  --  >1.10*  --  >1.10*  --   --   CREATININE 1.85*  --   --   --   --   --    < > = values in this interval not displayed.    Estimated Creatinine Clearance: 52.8 mL/min (A) (by C-G formula based on SCr of 1.85 mg/dL (H)).  Medications:  Apixaban  PTA - last dose 1/27 in AM  Assessment: Patient admitted with abdominal pain and concern of small bowel obstruction. PMH includes a fib on Apixaban  PTA, hx of stroke in 2019, diabetes, hypertension, and sleep apnea.  Date Time Results Comments 1/28 1436 aPTT=31 HL > 1.10, baseline 1/28 2232 aPTT=52 Rate 1500u/h 1/29 0616 aPTT=110 HL >1.1 , rate 1850u/h 1/29 1348 aPTT= 68 Therapeutic x 1, rate 1650u/h 1/29 1938 aPTT = 72 Therapeutic x 2  Goal of Therapy:  Heparin  level 0.3-0.7 units/ml aPTT 66-102 seconds Monitor platelets by anticoagulation protocol: Yes    Plan:  aPTT therapeutic x 2 at current rate and CBC stable. Will continue heparin  infusion at 1650u/h Stop heparin  infusion at 0000 in preparation for surgery. Will use aPTT to guide dosing until correlating with HL  Continue to monitor H&H and platelets  Kayla Blew III,  PharmD, BCPS 05/19/2024 8:41 PM

## 2024-05-19 NOTE — Consult Note (Signed)
 PHARMACY - ANTICOAGULATION CONSULT NOTE  Pharmacy Consult for heparin  infusion Indication: atrial fibrillation  Patient Measurements: Height: 6' (182.9 cm) Weight: 108.9 kg (240 lb) IBW/kg (Calculated) : 77.6 HEPARIN  DW (KG): 100.6  Vital Signs: Temp: 98.4 F (36.9 C) (01/29 0331) BP: 155/98 (01/29 0331) Pulse Rate: 100 (01/29 0331)  Labs: Recent Labs    05/18/24 0020 05/18/24 1436 05/18/24 2232 05/19/24 0616  HGB 11.5*  --   --   --   HCT 35.0*  --   --   --   PLT 195  --   --   --   APTT  --  31 52* 111*  HEPARINUNFRC  --  >1.10*  --  >1.10*  CREATININE 1.85*  --   --   --     Estimated Creatinine Clearance: 52.8 mL/min (A) (by C-G formula based on SCr of 1.85 mg/dL (H)).  Medications:  Apixaban  PTA - last dose 1/27 in AM  Assessment: Patient admitted with abdominal pain and concern of small bowel obstruction. PMH includes a fib on Apixaban  PTA, hx of stroke in 2019, diabetes, hypertension, and sleep apnea.  Date Time Results Comments 1/28 1436 aPTT=31 HL > 1.10, baseline 1/28 2232 aPTT=52 Rate 1500u/h 1/29 0616 aPTT=110 HL >1.1 , rate 1850u/h  Goal of Therapy:  Heparin  level 0.3-0.7 units/ml aPTT 66-102 seconds Monitor platelets by anticoagulation protocol: Yes    Plan:  aPTT SUPRAtherapeutic today. Will decrease heparin  infusion rate to 1650u/h recheck aPTT 6 hrs after rate change aPTT and HL not correlating. Will use aPTT to guide dosing until correlating with HL  Continue to monitor H&H and platelets  Krrish Freund Rodriguez-Guzman PharmD, BCPS 05/19/2024 7:43 AM

## 2024-05-19 NOTE — Assessment & Plan Note (Signed)
 Currently in A-fib with rate control. - IV metoprolol  Q8 hourly as patient is n.p.o. -Home Eliquis  was switched with heparin  for anticipated surgery

## 2024-05-19 NOTE — Assessment & Plan Note (Signed)
 Presented with abdominal pain, nausea and vomiting.  CT abdomen and pelvis with concern of SBO, transition point near mesh, with a history of prior terminal surgeries for hernia repair. S/p NG tube placement with copious amount of secretions. Surgery is on board. Gastrografin  challenge today. Surgery will decide about surgical intervention after the Gastrografin  studies-patient is high risk to go to the OR. - Continue with NG tube and suctioning -Continue with supportive care and IV fluid

## 2024-05-19 NOTE — Assessment & Plan Note (Signed)
CBG within goal. -Continue with SSI 

## 2024-05-19 NOTE — Consult Note (Addendum)
 PHARMACY - ANTICOAGULATION CONSULT NOTE  Pharmacy Consult for heparin  infusion Indication: atrial fibrillation  Patient Measurements: Height: 6' (182.9 cm) Weight: 108.9 kg (240 lb) IBW/kg (Calculated) : 77.6 HEPARIN  DW (KG): 100.6  Vital Signs: Temp: 98 F (36.7 C) (01/29 0745) BP: 147/104 (01/29 0745) Pulse Rate: 96 (01/29 0745)  Labs: Recent Labs    05/18/24 0020 05/18/24 1436 05/18/24 1436 05/18/24 2232 05/19/24 0616 05/19/24 1348  HGB 11.5*  --   --   --   --  11.2*  HCT 35.0*  --   --   --   --  34.3*  PLT 195  --   --   --   --  160  APTT  --  31   < > 52* 111* 68*  HEPARINUNFRC  --  >1.10*  --   --  >1.10*  --   CREATININE 1.85*  --   --   --   --   --    < > = values in this interval not displayed.    Estimated Creatinine Clearance: 52.8 mL/min (A) (by C-G formula based on SCr of 1.85 mg/dL (H)).  Medications:  Apixaban  PTA - last dose 1/27 in AM  Assessment: Patient admitted with abdominal pain and concern of small bowel obstruction. PMH includes a fib on Apixaban  PTA, hx of stroke in 2019, diabetes, hypertension, and sleep apnea.  Date Time Results Comments 1/28 1436 aPTT=31 HL > 1.10, baseline 1/28 2232 aPTT=52 Rate 1500u/h 1/29 0616 aPTT=110 HL >1.1 , rate 1850u/h 1/29 1348 aPTT= 68 Therapeutic x 1, rate 1650u/h  Goal of Therapy:  Heparin  level 0.3-0.7 units/ml aPTT 66-102 seconds Monitor platelets by anticoagulation protocol: Yes    Plan:  aPTT therapeutic at current rate and CBC stable. Will continue heparin  infusion at 1650u/h recheck aPTT 6 hrs to confirm Stop heparin  infusion at 0000 in preparation for surgery. Will use aPTT to guide dosing until correlating with HL  Continue to monitor H&H and platelets  Jaisen Wiltrout Rodriguez-Guzman PharmD, BCPS 05/19/2024 3:05 PM

## 2024-05-19 NOTE — Progress Notes (Signed)
 NG tube advanced 5cm per Arthea Platt, PA.

## 2024-05-19 NOTE — Progress Notes (Signed)
 [facility] SURGICAL PROGRESS NOTE (cpt [phone])  Hospital Day(s): 1.   Interval History: Patient seen and examined, no acute events or new complaints overnight. Patient reports he feels about the same. He denied any significant abdominal pain but feels bloated. No fever, chills, emesis. No new labs this morning. NGT with 2000 ccs out in last 24 hours; still enteric appearing. KUB still with air fluid levels and dilated small bowel. No flatus   Review of Systems:  Constitutional: denies fever, chills  HEENT: denies cough or congestion  Respiratory: denies any shortness of breath  Cardiovascular: denies chest pain or palpitations  Gastrointestinal: + distension, denied N/V Genitourinary: denies burning with urination or urinary frequency Musculoskeletal: denies pain, decreased motor or sensation  Vital signs in last 24 hours: [min-max] current  Temp:  [98 F (36.7 C)-98.9 F (37.2 C)] 98.4 F (36.9 C) (01/29 0331) Pulse Rate:  [79-100] 100 (01/29 0331) Resp:  [16-25] 20 (01/29 0331) BP: (140-164)/(75-98) 155/98 (01/29 0331) SpO2:  [94 %-97 %] 97 % (01/29 0331)     Height: 6' (182.9 cm) Weight: 108.9 kg BMI (Calculated): 32.54   Intake/Output last 2 shifts:  01/28 0701 - 01/29 0700 In: 2220 [I.V.:2220] Out: 2350 [Urine:350; Emesis/NG output:2000]   Physical Exam:  Constitutional: alert, cooperative and no distress  HENT: NGT in place; output still enteric appearing  Eyes: PERRL, EOM's grossly intact and symmetric  Respiratory: breathing non-labored at rest  Cardiovascular: regular rate and sinus rhythm  Gastrointestinal: Soft, mild central soreness, no gross distension, no rebound/guarding   Labs:     Latest Ref Rng & Units 05/18/2024   12:20 AM 12/16/2023    4:32 PM 02/18/2018    9:58 PM  CBC  WBC 4.0 - 10.5 K/uL 11.7  7.0  9.8   Hemoglobin 13.0 - 17.0 g/dL 88.4  89.6  87.1   Hematocrit 39.0 - 52.0 % 35.0  30.3  37.8   Platelets 150 - 400 K/uL 195  139   210       Latest Ref Rng & Units 05/18/2024   12:20 AM 10/01/2018    2:07 PM 04/06/2018    8:05 AM  CMP  Glucose 70 - 99 mg/dL 848     BUN 8 - 23 mg/dL 35     Creatinine 9.38 - 1.24 mg/dL 8.14  8.79  8.89   Sodium 135 - 145 mmol/L 140     Potassium 3.5 - 5.1 mmol/L 4.3     Chloride 98 - 111 mmol/L 103     CO2 22 - 32 mmol/L 23     Calcium  8.9 - 10.3 mg/dL 89.9     Total Protein 6.5 - 8.1 g/dL 7.9     Total Bilirubin 0.0 - 1.2 mg/dL 0.5     Alkaline Phos 38 - 126 U/L 82     AST 15 - 41 U/L 27     ALT 0 - 44 U/L 18        Imaging studies:   KUB (05/19/2024) personally reviewed still with dilated small bowel with air fluid levels, dilated stomach, and radiologist report reviewed:  IMPRESSION: 1. Persistent small bowel obstruction, with multiple dilated gas-filled loops of small bowel increased in caliber. 2. The side port for the enteric tube is just above the ge junction. Recommend advancing tube. 3. The urgent finding will be called to the ordering provider by the Professional Radiology Assistants (PRAs) and documented in the [facility] dashboard.   Assessment/Plan: (ICD-10's: K56.609) 63  y.o. 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    - Will proceed with gastrografin  challenge this morning; KUB at 8 hour interval - Continue NGT decompression; LIS; monitor and record output  - No need for emergent surgical intervention. We will proceed with gastrografin  challenge today. I do suspect he will require surgical intervention, likely ex lap, mesh resection, and SBR. Anticipate tomorrow (01/30) as long as no acute changes.              - Continue NPO; Continue IVF support  - Monitor abdominal examination; on-going bowel function   - Pain control prn; antiemetics prn   - Please hold heparin  at 0001 tomorrow in anticipation of surgery              - Okay to mobilize             - Further management per primary service; we will follow   All of the above findings and recommendations were discussed with the patient, and the medical team, and all of patient's questions were answered to his expressed satisfaction.   -- Arthea Platt, PA-C [facility] 05/19/2024, 7:15 AM M-F: 7am - 4pm

## 2024-05-19 NOTE — Assessment & Plan Note (Signed)
 Blood pressure elevated. - Home lisinopril and HCTZ is being held as patient is n.p.o. -IV metoprolol  Q8 hourly -As needed hydralazine 

## 2024-05-19 NOTE — Hospital Course (Addendum)
 Partly taken from H&P.  Khole Brumbaugh is a 64 y.o. male with medical history significant of a fib on oral anticoagulation, diabetes without complication, hypertension, sleep apnea comes to the emergency room with complaints of abdominal pain little over 24 hours which progressively worsening.  Patient has abdominal surgery for umbilical hernia repair in 1999 and subsequent ventral and umbilical hernia repair in 2004.  CT abdomen concerning for SBO with transition at the site of mesh.  NG tube was placed and general surgery was consulted.  1/29: Vital stable, KUB with persistent SBO, multiple dilated gas-filled loops with increasing caliber.  Gastrografin  studies today and then surgery decide about intervention.  1/30: Blood pressure elevated, potassium 3.3 with normal magnesium-being repleted.  Anemia panel consistent of anemia of chronic disease and some iron  deficiency.  Repeat imaging with some improvement in dilated bowel, started having bowel movements and passing flatus.  NG tube was pulled and he is being started on clear liquid diet.  1/31: Mildly elevated BP, repeat KUB with persistently dilated loops of small bowel, moderate amount of retained stool in the cecum and ascending colon to the hepatic flexure. Diet was advanced by surgery.  Patient wants to go home, not medically ready.  2/1: Repeat imaging with persistent SBO, having nausea without any obvious vomiting with food.  Likely be going for surgery tomorrow.

## 2024-05-19 NOTE — Progress Notes (Addendum)
" °  Progress Note   Patient: Erik [name] FMW:[id number] DOB: 25-Jan-1961 DOA: 05/18/2024     1 DOS: the patient was seen and examined on 05/19/2024   Brief hospital course: Partly taken from H&P.  Erik [name] is a 64 y.o. male with medical history significant of a fib on oral anticoagulation, diabetes without complication, hypertension, sleep apnea comes to the emergency room with complaints of abdominal pain little over 24 hours which progressively worsening.  Patient has abdominal surgery for umbilical hernia repair in 1999 and subsequent ventral and umbilical hernia repair in 2004.  CT abdomen concerning for SBO with transition at the site of mesh.  NG tube was placed and general surgery was consulted.  1/29: Vital stable, KUB with persistent SBO, multiple dilated gas-filled loops with increasing caliber.  Gastrografin  studies today and then surgery decide about intervention.  Assessment and Plan: * SBO (small bowel obstruction) (HCC) Presented with abdominal pain, nausea and vomiting.  CT abdomen and pelvis with concern of SBO, transition point near mesh, with a history of prior terminal surgeries for hernia repair. S/p NG tube placement with copious amount of secretions. Surgery is on board. Gastrografin  challenge today. Surgery will decide about surgical intervention after the Gastrografin  studies-patient is high risk to go to the OR. - Continue with NG tube and suctioning -Continue with supportive care and IV fluid  Paroxysmal atrial fibrillation (HCC) Currently in A-fib with rate control. - IV metoprolol  Q8 hourly as patient is n.p.o. -Home Eliquis  was switched with heparin  for anticipated surgery  Hypertension Blood pressure elevated. - Home lisinopril and HCTZ is being held as patient is n.p.o. -IV metoprolol  Q8 hourly -As needed hydralazine   Diabetes mellitus without complication (HCC) CBG within goal. - Continue with SSI  Chronic kidney disease (CKD), stage  III (moderate) (HCC) Patient with underlying CKD stage III A. Creatinine seems to be around baseline. - Monitor renal function - Avoid nephrotoxins   Subjective: Patient was sitting in chair with NG tube on suctioning when seen today.  No flatus or BM.  Physical Exam: Vitals:   05/19/24 0331 05/19/24 0745 05/19/24 1510 05/19/24 1541  BP: (!) 155/98 (!) 147/104 (!) 180/110 (!) 171/98  Pulse: 100 96 90 (!) 102  Resp: 20 18 20 18   Temp: 98.4 F (36.9 C) 98 F (36.7 C) 98.4 F (36.9 C) 99.6 F (37.6 C)  TempSrc:   Oral   SpO2: 97% 98% 97% 100%  Weight:      Height:       General.  Obese gentleman, in no acute distress.  NG tube in place Pulmonary.  Lungs clear bilaterally, normal respiratory effort. CV.  Irregularly irregular Abdomen.  Soft, mild central tenderness, nondistended, BS positive on left , hypoactive on right CNS.  Alert and oriented .  No focal neurologic deficit. Extremities.  No edema,  pulses intact and symmetrical. Psychiatry.  Judgment and insight appears normal.   Data Reviewed: Prior data reviewed  Family Communication: Discussed with patient  Disposition: Status is: Inpatient Remains inpatient appropriate because: Severity of illness  Planned Discharge Destination: Home  DVT prophylaxis.  Heparin  infusion Time spent: 50 minutes  This record has been created using Conservation officer, historic buildings. Errors have been sought and corrected,but may not always be located. Such creation errors do not reflect on the standard of care.   Author: Amaryllis Dare, MD 05/19/2024 3:56 PM  For on call review [email or web address].  "

## 2024-05-19 NOTE — Consult Note (Signed)
 PHARMACY - ANTICOAGULATION CONSULT NOTE  Pharmacy Consult for heparin  infusion Indication: atrial fibrillation  Patient Measurements: Height: 6' (182.9 cm) Weight: 108.9 kg (240 lb) IBW/kg (Calculated) : 77.6 HEPARIN  DW (KG): 100.6  Vital Signs: Temp: 98.9 F (37.2 C) (01/28 2031) BP: 145/75 (01/28 2031) Pulse Rate: 79 (01/28 2031)  Labs: Recent Labs    05/18/24 0020 05/18/24 1436 05/18/24 2232  HGB 11.5*  --   --   HCT 35.0*  --   --   PLT 195  --   --   APTT  --  31 52*  HEPARINUNFRC  --  >1.10*  --   CREATININE 1.85*  --   --     Estimated Creatinine Clearance: 52.8 mL/min (A) (by C-G formula based on SCr of 1.85 mg/dL (H)).   Medical History: Past Medical History:  Diagnosis Date   Arthritis    Diabetes mellitus without complication (HCC)    Dysrhythmia    a-fib   Edema    FEET/LEGS   Edema    FEET/LEGS   History of orthopnea    Hypertension    Orthopnea    Palpitations    Palpitations    Sleep apnea     Medications:  Apixaban  PTA - last dose 1/27 in AM  Assessment: Patient admitted with abdominal pain and concern of small bowel obstruction. PMH includes a fib on Apixaban  PTA, hx of stroke in 2019, diabetes, hypertension, and sleep apnea.  Goal of Therapy:  Heparin  level 0.3-0.7 units/ml aPTT 66-102 seconds Monitor platelets by anticoagulation protocol: Yes   Plan:  1/28:  aPTT @ 2232 = 52, SUBtherapeutic - will order heparin  3000 units IV X 1 bolus and increase drip rate to 1850 units/hr - recheck aPTT and HL 6 hrs after rate change - use aPTT to guide dosing until correlating with HL  Continue to monitor H&H and platelets  Willena Jeancharles D 05/19/2024 12:46 AM

## 2024-05-19 NOTE — TOC CM/SW Note (Signed)
 Transition of Care [facility]) - Inpatient Brief Assessment   Patient Details  Name: Erik [name] MRN: [id number] Date of Birth: 03-05-1961  Transition of Care [facility]) CM/SW Contact:    Corean ONEIDA Haddock, RN Phone Number: 05/19/2024, 11:06 AM   Clinical Narrative: Transition of Care Department [facility]) has reviewed patient and no TOC needs have been identified at this time.  If new patient transition needs arise, please place a TOC consult.    Transition of Care Asessment: Insurance and Status: Insurance coverage has been reviewed Patient has primary care physician: Yes     Prior/Current Home Services: No current home services Social Drivers of Health Review: SDOH reviewed no interventions necessary Readmission risk has been reviewed: Yes Transition of care needs: no transition of care needs at this time

## 2024-05-19 NOTE — Assessment & Plan Note (Addendum)
 Patient with underlying CKD stage III A. Creatinine seems to be around baseline. - Monitor renal function - Avoid nephrotoxins

## 2024-05-20 ENCOUNTER — Inpatient Hospital Stay

## 2024-05-20 ENCOUNTER — Encounter: Admission: EM | Disposition: A | Payer: Self-pay | Source: Home / Self Care | Attending: Internal Medicine

## 2024-05-20 DIAGNOSIS — R112 Nausea with vomiting, unspecified: Secondary | ICD-10-CM | POA: Diagnosis not present

## 2024-05-20 DIAGNOSIS — I480 Paroxysmal atrial fibrillation: Secondary | ICD-10-CM | POA: Diagnosis not present

## 2024-05-20 DIAGNOSIS — E119 Type 2 diabetes mellitus without complications: Secondary | ICD-10-CM | POA: Diagnosis not present

## 2024-05-20 DIAGNOSIS — R109 Unspecified abdominal pain: Secondary | ICD-10-CM | POA: Diagnosis not present

## 2024-05-20 DIAGNOSIS — K56609 Unspecified intestinal obstruction, unspecified as to partial versus complete obstruction: Secondary | ICD-10-CM | POA: Diagnosis not present

## 2024-05-20 DIAGNOSIS — I10 Essential (primary) hypertension: Secondary | ICD-10-CM | POA: Diagnosis not present

## 2024-05-20 LAB — VITAMIN B12: Vitamin B-12: 944 pg/mL — ABNORMAL HIGH (ref 180–914)

## 2024-05-20 LAB — RETICULOCYTES
Immature Retic Fract: 16.1 % — ABNORMAL HIGH (ref 2.3–15.9)
RBC.: 3.15 MIL/uL — ABNORMAL LOW (ref 4.22–5.81)
Retic Count, Absolute: 39.4 10*3/uL (ref 19.0–186.0)
Retic Ct Pct: 1.3 % (ref 0.4–3.1)

## 2024-05-20 LAB — CBC
HCT: 32.8 % — ABNORMAL LOW (ref 39.0–52.0)
Hemoglobin: 10.4 g/dL — ABNORMAL LOW (ref 13.0–17.0)
MCH: 32.3 pg (ref 26.0–34.0)
MCHC: 31.7 g/dL (ref 30.0–36.0)
MCV: 101.9 fL — ABNORMAL HIGH (ref 80.0–100.0)
Platelets: 158 10*3/uL (ref 150–400)
RBC: 3.22 MIL/uL — ABNORMAL LOW (ref 4.22–5.81)
RDW: 13.3 % (ref 11.5–15.5)
WBC: 6.2 10*3/uL (ref 4.0–10.5)
nRBC: 0 % (ref 0.0–0.2)

## 2024-05-20 LAB — GLUCOSE, CAPILLARY
Glucose-Capillary: 103 mg/dL — ABNORMAL HIGH (ref 70–99)
Glucose-Capillary: 122 mg/dL — ABNORMAL HIGH (ref 70–99)
Glucose-Capillary: 97 mg/dL (ref 70–99)
Glucose-Capillary: 101 mg/dL — ABNORMAL HIGH (ref 70–99)
Glucose-Capillary: 120 mg/dL — ABNORMAL HIGH (ref 70–99)

## 2024-05-20 LAB — IRON AND TIBC
Iron: 36 ug/dL — ABNORMAL LOW (ref 45–182)
Saturation Ratios: 14 % — ABNORMAL LOW (ref 17.9–39.5)
TIBC: 263 ug/dL (ref 250–450)
UIBC: 228 ug/dL

## 2024-05-20 LAB — BASIC METABOLIC PANEL WITH GFR
Anion gap: 12 (ref 5–15)
BUN: 41 mg/dL — ABNORMAL HIGH (ref 8–23)
CO2: 24 mmol/L (ref 22–32)
Calcium: 8.9 mg/dL (ref 8.9–10.3)
Chloride: 109 mmol/L (ref 98–111)
Creatinine, Ser: 1.82 mg/dL — ABNORMAL HIGH (ref 0.61–1.24)
GFR, Estimated: 41 mL/min — ABNORMAL LOW
Glucose, Bld: 134 mg/dL — ABNORMAL HIGH (ref 70–99)
Potassium: 3.3 mmol/L — ABNORMAL LOW (ref 3.5–5.1)
Sodium: 145 mmol/L (ref 135–145)

## 2024-05-20 LAB — APTT: aPTT: 65 s — ABNORMAL HIGH (ref 24–36)

## 2024-05-20 LAB — FERRITIN: Ferritin: 66 ng/mL (ref 24–336)

## 2024-05-20 LAB — FOLATE: Folate: >20 ng/mL

## 2024-05-20 LAB — MAGNESIUM: Magnesium: 1.9 mg/dL (ref 1.7–2.4)

## 2024-05-20 MED ORDER — HEPARIN BOLUS VIA INFUSION
5000.0000 [IU] | Freq: Once | INTRAVENOUS | Status: AC
  Administered 2024-05-20: 5000 [IU] via INTRAVENOUS
  Filled 2024-05-20: qty 5000

## 2024-05-20 MED ORDER — HEPARIN (PORCINE) 25000 UT/250ML-% IV SOLN
1850.0000 [IU]/h | INTRAVENOUS | Status: AC
  Administered 2024-05-20: 1650 [IU]/h via INTRAVENOUS
  Administered 2024-05-21 (×2): 1850 [IU]/h via INTRAVENOUS
  Filled 2024-05-20 (×3): qty 250

## 2024-05-20 MED ORDER — LISINOPRIL 20 MG PO TABS
20.0000 mg | ORAL_TABLET | Freq: Every day | ORAL | Status: DC
  Administered 2024-05-20 – 2024-05-22 (×3): 20 mg via ORAL
  Filled 2024-05-20 (×3): qty 1

## 2024-05-20 MED ORDER — HEPARIN BOLUS VIA INFUSION
1500.0000 [IU] | Freq: Once | INTRAVENOUS | Status: AC
  Administered 2024-05-20: 1500 [IU] via INTRAVENOUS
  Filled 2024-05-20: qty 1500

## 2024-05-20 MED ORDER — HYDROCHLOROTHIAZIDE 12.5 MG PO TABS
12.5000 mg | ORAL_TABLET | Freq: Every day | ORAL | Status: DC
  Administered 2024-05-20 – 2024-05-22 (×3): 12.5 mg via ORAL
  Filled 2024-05-20 (×3): qty 1

## 2024-05-20 MED ORDER — LISINOPRIL-HYDROCHLOROTHIAZIDE 20-12.5 MG PO TABS: 1.0000 | ORAL_TABLET | Freq: Every day | ORAL | Status: DC

## 2024-05-20 MED ORDER — TOPIRAMATE 25 MG PO TABS
50.0000 mg | ORAL_TABLET | Freq: Every day | ORAL | Status: DC
  Administered 2024-05-20 – 2024-05-22 (×3): 50 mg via ORAL
  Filled 2024-05-20 (×4): qty 2

## 2024-05-20 MED ORDER — HEPARIN BOLUS VIA INFUSION
1400.0000 [IU] | Freq: Once | INTRAVENOUS | Status: DC
  Filled 2024-05-20: qty 1400

## 2024-05-20 MED ORDER — POTASSIUM CHLORIDE 10 MEQ/100ML IV SOLN
10.0000 meq | INTRAVENOUS | Status: AC
  Administered 2024-05-20 (×4): 10 meq via INTRAVENOUS
  Filled 2024-05-20: qty 100

## 2024-05-20 MED ORDER — METOPROLOL TARTRATE 50 MG PO TABS
50.0000 mg | ORAL_TABLET | Freq: Two times a day (BID) | ORAL | Status: DC
  Administered 2024-05-20 – 2024-05-23 (×7): 50 mg via ORAL
  Filled 2024-05-20 (×7): qty 1

## 2024-05-20 NOTE — Assessment & Plan Note (Signed)
 Blood pressure mildly elevated. - Continue Home lisinopril , metoprolol  and HCTZ  -As needed hydralazine 

## 2024-05-20 NOTE — Assessment & Plan Note (Signed)
 Presented with abdominal pain, nausea and vomiting.  CT abdomen and pelvis with concern of SBO, transition point near mesh, with a history of prior terminal surgeries for hernia repair. S/p NG tube placement with copious amount of secretions. Surgery is on board. Gastrografin  challenge did not show any contrast in the colon. Repeat KUB today with persistent SBO NG tube was pulled yesterday -Slowly advancing diet by surgery -Continue with supportive care  - Continue to monitor

## 2024-05-20 NOTE — Consult Note (Signed)
 PHARMACY - ANTICOAGULATION CONSULT NOTE  Pharmacy Consult for heparin  infusion Indication: atrial fibrillation  Patient Measurements: Height: 6' (182.9 cm) Weight: 108.9 kg (240 lb) IBW/kg (Calculated) : 77.6 HEPARIN  DW (KG): 100.6  Vital Signs: Temp: 97.8 F (36.6 C) (01/30 2033) Temp Source: Oral (01/30 2033) BP: 151/105 (01/30 2033) Pulse Rate: 85 (01/30 2033)  Labs: Recent Labs    05/18/24 0020 05/18/24 1436 05/18/24 2232 05/19/24 0616 05/19/24 1348 05/19/24 1938 05/20/24 0423 05/20/24 2111  HGB 11.5*  --   --   --  11.2*  --  10.4*  --   HCT 35.0*  --   --   --  34.3*  --  32.8*  --   PLT 195  --   --   --  160  --  158  --   APTT  --  31   < > 111* 68* 72*  --  65*  HEPARINUNFRC  --  >1.10*  --  >1.10*  --   --   --   --   CREATININE 1.85*  --   --   --   --   --  1.82*  --    < > = values in this interval not displayed.    Estimated Creatinine Clearance: 53.6 mL/min (A) (by C-G formula based on SCr of 1.82 mg/dL (H)).  Medications:  Apixaban  PTA - last dose 1/27 in AM  Assessment: Patient admitted with abdominal pain and concern of small bowel obstruction. PMH includes a fib on Apixaban  PTA, hx of stroke in 2019, diabetes, hypertension, and sleep apnea.  Date Time Results Comments 1/28 1436 aPTT=31 HL > 1.10, baseline 1/28 2232 aPTT=52 Rate 1500u/h 1/29 0616 aPTT=110 HL >1.1 , rate 1850u/h 1/29 1348 aPTT= 68 Therapeutic x 1, rate 1650u/h 1/29 1938 aPTT = 72 Therapeutic x 2 1/29 1400 Procedure canceled. Resume heparin   1/30     2111    aPTT = 65       SUBtherapeutic   Goal of Therapy:  Heparin  level 0.3-0.7 units/ml aPTT 66-102 seconds Monitor platelets by anticoagulation protocol: Yes    Plan:  1/30:  aPTT @ 2111 = 65, SUBtherapeutic - Will order heparin  1500 units IV X 1 bolus and increase drip rate to 1850 units/hr - Recheck aPTT and HL 6 hrs after rate change Will use aPTT to guide dosing until correlating with HL  Continue to monitor H&H  and platelets  Ashaunte Standley D 05/20/2024 10:53 PM

## 2024-05-20 NOTE — Plan of Care (Signed)
   Problem: Education: Goal: Knowledge of General Education information will improve Description Including pain rating scale, medication(s)/side effects and non-pharmacologic comfort measures Outcome: Progressing

## 2024-05-20 NOTE — Progress Notes (Signed)
 " Progress Note   Patient: Erik [name] FMW:[id number] DOB: May 29, 1960 DOA: 05/18/2024     2 DOS: the patient was seen and examined on 05/20/2024   Brief hospital course: Partly taken from H&P.  Erik [name] is a 64 y.o. male with medical history significant of a fib on oral anticoagulation, diabetes without complication, hypertension, sleep apnea comes to the emergency room with complaints of abdominal pain little over 24 hours which progressively worsening.  Patient has abdominal surgery for umbilical hernia repair in 1999 and subsequent ventral and umbilical hernia repair in 2004.  CT abdomen concerning for SBO with transition at the site of mesh.  NG tube was placed and general surgery was consulted.  1/29: Vital stable, KUB with persistent SBO, multiple dilated gas-filled loops with increasing caliber.  Gastrografin  studies today and then surgery decide about intervention.  1/30: Blood pressure elevated, potassium 3.3 with normal magnesium-being repleted.  Anemia panel consistent of anemia of chronic disease and some iron  deficiency.  Repeat imaging with some improvement in dilated bowel, started having bowel movements and passing flatus.  NG tube was pulled and he is being started on clear liquid diet.  Assessment and Plan: * SBO (small bowel obstruction) (HCC) Presented with abdominal pain, nausea and vomiting.  CT abdomen and pelvis with concern of SBO, transition point near mesh, with a history of prior terminal surgeries for hernia repair. S/p NG tube placement with copious amount of secretions. Surgery is on board. Gastrografin  challenge did not show any contrast in the colon but patient started getting bowel movement this morning with decreased in NG tube secretions so it was pulled by general surgery. -Patient was started on clear liquid diet -Continue with supportive care  - Continue to monitor  Paroxysmal atrial fibrillation (HCC) Currently in A-fib with rate  control. - Restarting p.o. home metoprolol  as patient is now on clear liquid diet -Home Eliquis  was switched with heparin  for anticipated surgery-we will switch back to Eliquis  tomorrow if remains stable  Hypertension Blood pressure elevated. - Restarting Home lisinopril  and HCTZ  -As needed hydralazine   Diabetes mellitus without complication (HCC) CBG within goal. - Continue with SSI  Chronic kidney disease (CKD), stage III (moderate) (HCC) Patient with underlying CKD stage III A. Creatinine seems to be around baseline. - Monitor renal function - Avoid nephrotoxins   Subjective: Patient was seen and examined today.  Had 3 bowel movements earlier today, no abdominal pain.  He was concerned that he might have another obstruction and asking for a definitive treatment.  Physical Exam: Vitals:   05/19/24 1946 05/20/24 0342 05/20/24 0739 05/20/24 1528  BP: (!) 166/98 (!) 158/95 (!) 164/103 (!) 162/112  Pulse: 97 98 73 83  Resp: 16 16 16 17   Temp: 98.9 F (37.2 C) 98.8 F (37.1 C) 97.9 F (36.6 C) 98.9 F (37.2 C)  TempSrc: Oral Oral Oral   SpO2: 93% 96% 98% 98%  Weight:      Height:       General.  Obese gentleman, in no acute distress. Pulmonary.  Lungs clear bilaterally, normal respiratory effort. CV.  Regular rate and rhythm, no JVD, rub or murmur. Abdomen.  Soft, nontender, nondistended, BS positive. CNS.  Alert and oriented .  No focal neurologic deficit. Extremities.  No edema, no cyanosis, pulses intact and symmetrical. Psychiatry.  Judgment and insight appears normal.   Data Reviewed: Prior data reviewed  Family Communication: Discussed with patient  Disposition: Status is: Inpatient Remains inpatient appropriate because: Severity of  illness  Planned Discharge Destination: Home  DVT prophylaxis.  Heparin  infusion Time spent: 50 minutes  This record has been created using Conservation officer, historic buildings. Errors have been sought and corrected,but may not  always be located. Such creation errors do not reflect on the standard of care.   Author: Amaryllis Dare, MD 05/20/2024 3:47 PM  For on call review [email or web address].  "

## 2024-05-20 NOTE — Consult Note (Signed)
 PHARMACY - ANTICOAGULATION CONSULT NOTE  Pharmacy Consult for heparin  infusion Indication: atrial fibrillation  Patient Measurements: Height: 6' (182.9 cm) Weight: 108.9 kg (240 lb) IBW/kg (Calculated) : 77.6 HEPARIN  DW (KG): 100.6  Vital Signs: Temp: 97.9 F (36.6 C) (01/30 0739) Temp Source: Oral (01/30 0739) BP: 164/103 (01/30 0739) Pulse Rate: 73 (01/30 0739)  Labs: Recent Labs    05/18/24 0020 05/18/24 1436 05/18/24 2232 05/19/24 0616 05/19/24 1348 05/19/24 1938 05/20/24 0423  HGB 11.5*  --   --   --  11.2*  --  10.4*  HCT 35.0*  --   --   --  34.3*  --  32.8*  PLT 195  --   --   --  160  --  158  APTT  --  31   < > 111* 68* 72*  --   HEPARINUNFRC  --  >1.10*  --  >1.10*  --   --   --   CREATININE 1.85*  --   --   --   --   --  1.82*   < > = values in this interval not displayed.    Estimated Creatinine Clearance: 53.6 mL/min (A) (by C-G formula based on SCr of 1.82 mg/dL (H)).  Medications:  Apixaban  PTA - last dose 1/27 in AM  Assessment: Patient admitted with abdominal pain and concern of small bowel obstruction. PMH includes a fib on Apixaban  PTA, hx of stroke in 2019, diabetes, hypertension, and sleep apnea.  Date Time Results Comments 1/28 1436 aPTT=31 HL > 1.10, baseline 1/28 2232 aPTT=52 Rate 1500u/h 1/29 0616 aPTT=110 HL >1.1 , rate 1850u/h 1/29 1348 aPTT= 68 Therapeutic x 1, rate 1650u/h 1/29 1938 aPTT = 72 Therapeutic x 2 1/29 1400 Procedure canceled. Resume heparin    Goal of Therapy:  Heparin  level 0.3-0.7 units/ml aPTT 66-102 seconds Monitor platelets by anticoagulation protocol: Yes    Plan:  Heparin  stopped since 1/30 @ 0037. Will order heparin  IV bolus of 5000 units IV x 1, then resume heparin  infusion at 1650u/h Will use aPTT to guide dosing until correlating with HL  Next aPTT 6 hours after heparin  re-start Continue to monitor H&H and platelets  Nancy Arvin Rodriguez-Guzman PharmD, BCPS 05/20/2024 2:03 PM

## 2024-05-20 NOTE — Progress Notes (Signed)
 [facility] SURGICAL PROGRESS NOTE  Hospital Day(s): 2.   Interval History:  Patient seen and examined No acute events or new complaints overnight.  Patient reports he feels okay this AM His pain has improved No fever, chills, emesis He is without leukocytosis; WBC 6.2K Hgb to 10.4 sCr - 1.82; 200 ccs + unmeasured  Hypokalemia to 3.3 NGT with 1750 ccs out; still dark, stagnant fluid He did have a BM; no flatus    Vital signs in last 24 hours: [min-max] current  Temp:  [97.9 F (36.6 C)-99.6 F (37.6 C)] 97.9 F (36.6 C) (01/30 0739) Pulse Rate:  [73-102] 73 (01/30 0739) Resp:  [16-20] 16 (01/30 0739) BP: (158-180)/(95-110) 164/103 (01/30 0739) SpO2:  [93 %-100 %] 98 % (01/30 0739)     Height: 6' (182.9 cm) Weight: 108.9 kg BMI (Calculated): 32.54   Intake/Output last 2 shifts:  01/29 0701 - 01/30 0700 In: 1465.7 [I.V.:1375.7; NG/GT:90] Out: 1950 [Urine:200; Emesis/NG output:1750]   Physical Exam:  Constitutional: alert, cooperative and no distress  HENT: NGT in place; output still enteric appearing  Eyes: PERRL, EOM's grossly intact and symmetric  Respiratory: breathing non-labored at rest  Cardiovascular: regular rate and sinus rhythm  Gastrointestinal: Soft, mild central soreness, no gross distension, no rebound/guarding  Labs:     Latest Ref Rng & Units 05/20/2024    4:23 AM 05/19/2024    1:48 PM 05/18/2024   12:20 AM  CBC  WBC 4.0 - 10.5 K/uL 6.2  7.3  11.7   Hemoglobin 13.0 - 17.0 g/dL 89.5  88.7  88.4   Hematocrit 39.0 - 52.0 % 32.8  34.3  35.0   Platelets 150 - 400 K/uL 158  160  195       Latest Ref Rng & Units 05/20/2024    4:23 AM 05/18/2024   12:20 AM 10/01/2018    2:07 PM  CMP  Glucose 70 - 99 mg/dL 865  848    BUN 8 - 23 mg/dL 41  35    Creatinine 9.38 - 1.24 mg/dL 8.17  8.14  8.79   Sodium 135 - 145 mmol/L 145  140    Potassium 3.5 - 5.1 mmol/L 3.3  4.3    Chloride 98 - 111 mmol/L 109  103    CO2 22 - 32 mmol/L 24  23     Calcium  8.9 - 10.3 mg/dL 8.9  89.9    Total Protein 6.5 - 8.1 g/dL  7.9    Total Bilirubin 0.0 - 1.2 mg/dL  0.5    Alkaline Phos 38 - 126 U/L  82    AST 15 - 41 U/L  27    ALT 0 - 44 U/L  18      Imaging studies:   KUB (05/20/2024) personally reviewed now with improving small bowel pattern, he does have contrast in colon, and radiologist report reviewed below:  IMPRESSION: 1. Improved small bowel dilatation compared to prior. 2. Enteric tube tip projects at the gastric fundus.  BOWEL: Improvement in degree of dilatation and multiplicity of dilated small bowel loops compatible with improving small-bowel obstruction. Delayed contrast material now reaches the colon to the level of the distal transverse colon.    Assessment/Plan: (ICD-10's: K39.609) 64 y.o. 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   - He has had return of bowel function overnight and KUB this morning with improving small bowel  pattern and contrast now in the colon. As such, we will likely defer on emergent surgical intervention at this time. He understands this surgery will likely be quite extensive and involve mesh excision and likely SBR.   - Will reassess need for NGT             - Continue NPO for now; Continue IVF support  - Monitor abdominal examination; on-going bowel function   - Pain control prn; antiemetics prn              - If no operative plans at this time, may resume heparin  gtt             - Okay to mobilize             - Further management per primary service; we will follow   All of the above findings and recommendations were discussed with the patient, and the medical team, and all of patient's  questions were answered to his expressed satisfaction.  -- Arthea Platt, PA-C [facility] 05/20/2024, 10:48 AM M-F: 7am - 4pm

## 2024-05-20 NOTE — Assessment & Plan Note (Signed)
 Currently in A-fib with rate control. - Restarting p.o. home metoprolol  as patient is now on clear liquid diet -Home Eliquis  was switched with heparin  for anticipated surgery-we will switch back to Eliquis  tomorrow if remains stable

## 2024-05-21 ENCOUNTER — Inpatient Hospital Stay

## 2024-05-21 DIAGNOSIS — K56609 Unspecified intestinal obstruction, unspecified as to partial versus complete obstruction: Secondary | ICD-10-CM | POA: Diagnosis not present

## 2024-05-21 DIAGNOSIS — I480 Paroxysmal atrial fibrillation: Secondary | ICD-10-CM | POA: Diagnosis not present

## 2024-05-21 DIAGNOSIS — R109 Unspecified abdominal pain: Secondary | ICD-10-CM | POA: Diagnosis not present

## 2024-05-21 DIAGNOSIS — I10 Essential (primary) hypertension: Secondary | ICD-10-CM | POA: Diagnosis not present

## 2024-05-21 LAB — BASIC METABOLIC PANEL WITH GFR
Anion gap: 10 (ref 5–15)
BUN: 31 mg/dL — ABNORMAL HIGH (ref 8–23)
CO2: 27 mmol/L (ref 22–32)
Calcium: 9.3 mg/dL (ref 8.9–10.3)
Chloride: 103 mmol/L (ref 98–111)
Creatinine, Ser: 1.81 mg/dL — ABNORMAL HIGH (ref 0.61–1.24)
GFR, Estimated: 42 mL/min — ABNORMAL LOW
Glucose, Bld: 163 mg/dL — ABNORMAL HIGH (ref 70–99)
Potassium: 4 mmol/L (ref 3.5–5.1)
Sodium: 139 mmol/L (ref 135–145)

## 2024-05-21 LAB — HEPARIN LEVEL (UNFRACTIONATED)
Heparin Unfractionated: 0.54 [IU]/mL (ref 0.30–0.70)
Heparin Unfractionated: 0.48 [IU]/mL (ref 0.30–0.70)

## 2024-05-21 LAB — GLUCOSE, CAPILLARY
Glucose-Capillary: 118 mg/dL — ABNORMAL HIGH (ref 70–99)
Glucose-Capillary: 116 mg/dL — ABNORMAL HIGH (ref 70–99)
Glucose-Capillary: 146 mg/dL — ABNORMAL HIGH (ref 70–99)
Glucose-Capillary: 133 mg/dL — ABNORMAL HIGH (ref 70–99)

## 2024-05-21 LAB — APTT: aPTT: 80 s — ABNORMAL HIGH (ref 24–36)

## 2024-05-21 NOTE — Assessment & Plan Note (Signed)
CBG within goal. -Continue with SSI 

## 2024-05-21 NOTE — Plan of Care (Signed)
   Problem: Education: Goal: Knowledge of General Education information will improve Description Including pain rating scale, medication(s)/side effects and non-pharmacologic comfort measures Outcome: Progressing

## 2024-05-21 NOTE — Consult Note (Signed)
 PHARMACY - ANTICOAGULATION CONSULT NOTE  Pharmacy Consult for heparin  infusion Indication: atrial fibrillation  Patient Measurements: Height: 6' (182.9 cm) Weight: 108.9 kg (240 lb) IBW/kg (Calculated) : 77.6 HEPARIN  DW (KG): 100.6  Vital Signs: Temp: 98.7 F (37.1 C) (01/31 0813) Temp Source: Oral (01/31 0357) BP: 141/94 (01/31 0813) Pulse Rate: 94 (01/31 0813)  Labs: Recent Labs    05/18/24 1436 05/18/24 2232 05/19/24 0616 05/19/24 1348 05/19/24 1938 05/20/24 0423 05/20/24 2111 05/21/24 0530  HGB  --   --   --  11.2*  --  10.4*  --   --   HCT  --   --   --  34.3*  --  32.8*  --   --   PLT  --   --   --  160  --  158  --   --   APTT 31   < > 111* 68* 72*  --  65* 80*  HEPARINUNFRC >1.10*  --  >1.10*  --   --   --   --  0.54  CREATININE  --   --   --   --   --  1.82*  --   --    < > = values in this interval not displayed.    Estimated Creatinine Clearance: 53.6 mL/min (A) (by C-G formula based on SCr of 1.82 mg/dL (H)).  Medications:  Apixaban  PTA - last dose 1/27 in AM  Assessment: Patient admitted with abdominal pain and concern of small bowel obstruction. PMH includes a fib on Apixaban  PTA, hx of stroke in 2019, diabetes, hypertension, and sleep apnea.  Goal of Therapy:  Heparin  level 0.3-0.7 units/ml Monitor platelets by anticoagulation protocol: Yes    Plan: heparin  level therapeutic  x 2, now correlating with HL  - continue heparin  infusion at 1850 units/hr - will use heparin  levels to guide infusion rate - recheck next heparin  level in am 02/01 - Continue to monitor H&H and platelets  Adriana JONETTA Bolster 05/21/2024 12:26 PM

## 2024-05-21 NOTE — Progress Notes (Signed)
 CC: SBO Subjective: Feeling better, + flatus Kub w air in  colon but some dilated bowel pers revviewed  Objective: Vital signs in last 24 hours: Temp:  [97.7 F (36.5 C)-98.9 F (37.2 C)] 98.7 F (37.1 C) (01/31 0813) Pulse Rate:  [83-94] 94 (01/31 0813) Resp:  [16-17] 17 (01/31 0813) BP: (141-167)/(94-112) 141/94 (01/31 0813) SpO2:  [98 %-99 %] 99 % (01/31 0813) Last BM Date : 05/20/24  Intake/Output from previous day: 01/30 0701 - 01/31 0700 In: 1391.7 [P.O.:240; I.V.:1151.7] Out: 300 [Emesis/NG output:300] Intake/Output this shift: Total I/O In: 360 [P.O.:360] Out: -   Physical exam:  NAD alert Abd: soft, nt no rebound  Lab Results: CBC  Recent Labs    05/19/24 1348 05/20/24 0423  WBC 7.3 6.2  HGB 11.2* 10.4*  HCT 34.3* 32.8*  PLT 160 158   BMET Recent Labs    05/20/24 0423 05/21/24 1255  NA 145 139  K 3.3* 4.0  CL 109 103  CO2 24 27  GLUCOSE 134* 163*  BUN 41* 31*  CREATININE 1.82* 1.81*  CALCIUM  8.9 9.3   PT/INR No results for input(s): LABPROT, INR in the last 72 hours. ABG No results for input(s): PHART, HCO3 in the last 72 hours.  Invalid input(s): PCO2, PO2  Studies/Results: DG ABD ACUTE 2+V W 1V CHEST Result Date: 05/21/2024 EXAM: Upright and Supine Xray Views of the Abdomen and 1 View(s) of the Chest 05/21/2024 10:53:00 AM COMPARISON: None available. CLINICAL HISTORY: Small bowel obstruction (HCC). FINDINGS: LUNGS AND PLEURA: No consolidation or pulmonary edema. No pleural effusion or pneumothorax. HEART AND MEDIASTINUM: No acute abnormality of the cardiac and mediastinal silhouettes. BOWEL: Persistent dilated loops of small bowel measuring up to 4 cm. Previously, these measured up to 4.6 cm. There is a moderate amount of retained stool identified within the cecum and ascending colon to the hepatic flexure. PERITONEUM AND SOFT TISSUES: No abnormal calcifications. No free air. BONES: Old healed right rib fractures. No acute  osseous abnormality. VASCULATURE: Aortic atherosclerosis. IMPRESSION: 1. Persistent dilated loops of small bowel measuring up to 4 cm, previously 4.6 cm, consistent with small bowel obstruction. 2. Moderate amount of retained stool in the cecum and ascending colon to the hepatic flexure. Electronically signed by: Waddell Calk MD 05/21/2024 01:50 PM EST RP Workstation: [id number]   DG Abd 1 View Result Date: 05/20/2024 EXAM: 1 VIEW XRAY OF THE ABDOMEN 05/20/2024 08:14:00 AM COMPARISON: 05/19/2024 CLINICAL HISTORY: Small bowel obstruction (HCC). FINDINGS: LINES, TUBES AND DEVICES: Enteric tube in place with tip at the gastric fundus. The side port is just below the ge junction, and the gastric lumen appears decompressed. BOWEL: Improvement in degree of dilatation and multiplicity of dilated small bowel loops compatible with improving small-bowel obstruction. Delayed contrast material now reaches the colon to the level of the distal transverse colon. SOFT TISSUES: No abnormal calcifications. BONES: No acute fracture. IMPRESSION: 1. Improved small bowel dilatation compared to prior. 2. Enteric tube tip projects at the gastric fundus. Electronically signed by: Waddell Calk MD 05/20/2024 08:17 AM EST RP Workstation: [id number]   DG Abd Portable 1V-Small Bowel Obstruction Protocol-initial, 8 hr delay Result Date: 05/19/2024 EXAM: 1 VIEW XRAY OF THE ABDOMEN 05/19/2024 05:28:00 PM COMPARISON: 05/19/2024 CLINICAL HISTORY: FINDINGS: LINES, TUBES AND DEVICES: Enteric tube in place, terminating in the proximal stomach with the side hole at the level of the gastric cardia. BOWEL: Stable dilated loops of small bowel throughout the abdomen in keeping with a mid to distal small bowel obstruction.  Administered enteric contrast opacifies several proximal bowel loops, but does not extend into the mid to distal small bowel or the colon itself in keeping with a high-grade small bowel obstruction. No free intraperitoneal gas. SOFT  TISSUES: No abnormal calcifications. BONES: Levoconvex lumbar spine curvature. No acute osseous abnormality. IMPRESSION: 1. Stable high-grade small bowel obstruction without free intraperitoneal gas. 2. Enteric tube terminates in the proximal stomach with side hole at the level of the gastric cardia. Electronically signed by: Dorethia Molt MD 05/19/2024 10:16 PM EST RP Workstation: [id number]    Anti-infectives: Anti-infectives (From admission, onward)    None       Assessment/Plan: Resolving SBO NO need for surgical intervention Clears today, take it slow I personally spent a total of 35 minutes in the care of the patient today including performing a medically appropriate exam/evaluation, counseling and educating, placing orders, referring and communicating with other health care professionals, documenting clinical information in the EHR, independently interpreting and reviewing images studies and coordinating care.     Laneta Luna, MD, [facility]  05/21/2024

## 2024-05-21 NOTE — Progress Notes (Signed)
 RUR: 13%  Transition of Care Department (TOC) has reviewed patient and no TOC needs have been identified at this time.  If new patient transition needs arise, please place a TOC consult.  Kelliann Pendergraph CM

## 2024-05-21 NOTE — Progress Notes (Signed)
 " Progress Note   Patient: Erik [name] FMW:[id number] DOB: 1960/05/15 DOA: 05/18/2024     3 DOS: the patient was seen and examined on 05/21/2024   Brief hospital course: Partly taken from H&P.  Erik [name] is a 64 y.o. male with medical history significant of a fib on oral anticoagulation, diabetes without complication, hypertension, sleep apnea comes to the emergency room with complaints of abdominal pain little over 24 hours which progressively worsening.  Patient has abdominal surgery for umbilical hernia repair in 1999 and subsequent ventral and umbilical hernia repair in 2004.  CT abdomen concerning for SBO with transition at the site of mesh.  NG tube was placed and general surgery was consulted.  1/29: Vital stable, KUB with persistent SBO, multiple dilated gas-filled loops with increasing caliber.  Gastrografin  studies today and then surgery decide about intervention.  1/30: Blood pressure elevated, potassium 3.3 with normal magnesium-being repleted.  Anemia panel consistent of anemia of chronic disease and some iron  deficiency.  Repeat imaging with some improvement in dilated bowel, started having bowel movements and passing flatus.  NG tube was pulled and he is being started on clear liquid diet.  1/31: Mildly elevated BP, repeat KUB with persistently dilated loops of small bowel, moderate amount of retained stool in the cecum and ascending colon to the hepatic flexure. Diet was advanced by surgery.  Patient wants to go home, not medically ready.  Assessment and Plan: * SBO (small bowel obstruction) (HCC) Presented with abdominal pain, nausea and vomiting.  CT abdomen and pelvis with concern of SBO, transition point near mesh, with a history of prior terminal surgeries for hernia repair. S/p NG tube placement with copious amount of secretions. Surgery is on board. Gastrografin  challenge did not show any contrast in the colon. Repeat KUB today with persistent SBO NG tube  was pulled yesterday -Slowly advancing diet by surgery -Continue with supportive care  - Continue to monitor  Paroxysmal atrial fibrillation (HCC) Currently in A-fib with rate control. - Restarting p.o. home metoprolol  as patient is now on clear liquid diet -Home Eliquis  was switched with heparin  for anticipated surgery-we will switch back to Eliquis  tomorrow if remains stable  Hypertension Blood pressure elevated. - Continue Home lisinopril , metoprolol  and HCTZ  -As needed hydralazine   Diabetes mellitus without complication (HCC) CBG within goal. - Continue with SSI  Chronic kidney disease (CKD), stage III (moderate) (HCC) Patient with underlying CKD stage III A. Creatinine seems to be around baseline. - Monitor renal function - Avoid nephrotoxins   Subjective: Patient was walking around the unit when seen today.  Asking to go home.  No BM after yesterday.  No nausea or vomiting.  Physical Exam: Vitals:   05/20/24 1528 05/20/24 2033 05/21/24 0357 05/21/24 0813  BP: (!) 162/112 (!) 151/105 (!) 167/103 (!) 141/94  Pulse: 83 85 84 94  Resp: 17 16 16 17   Temp: 98.9 F (37.2 C) 97.8 F (36.6 C) 97.7 F (36.5 C) 98.7 F (37.1 C)  TempSrc: Oral Oral Oral   SpO2: 98% 98% 99% 99%  Weight:      Height:       General.  Obese gentleman, in no acute distress. Pulmonary.  Lungs clear bilaterally, normal respiratory effort. CV.  Regular rate and rhythm, no JVD, rub or murmur. Abdomen.  Soft, nontender, nondistended, BS little hypoactive CNS.  Alert and oriented .  No focal neurologic deficit. Extremities.  No edema,  pulses intact and symmetrical. Psychiatry.  Judgment and insight appears normal.  Data Reviewed: Prior data reviewed  Family Communication: Discussed with patient  Disposition: Status is: Inpatient Remains inpatient appropriate because: Severity of illness  Planned Discharge Destination: Home  DVT prophylaxis.  Heparin  infusion Time spent: 50  minutes  This record has been created using Conservation officer, historic buildings. Errors have been sought and corrected,but may not always be located. Such creation errors do not reflect on the standard of care.   Author: Amaryllis Dare, MD 05/21/2024 3:53 PM  For on call review [email or web address].  "

## 2024-05-21 NOTE — Consult Note (Signed)
 PHARMACY - ANTICOAGULATION CONSULT NOTE  Pharmacy Consult for heparin  infusion Indication: atrial fibrillation  Patient Measurements: Height: 6' (182.9 cm) Weight: 108.9 kg (240 lb) IBW/kg (Calculated) : 77.6 HEPARIN  DW (KG): 100.6  Vital Signs: Temp: 97.7 F (36.5 C) (01/31 0357) Temp Source: Oral (01/31 0357) BP: 167/103 (01/31 0357) Pulse Rate: 84 (01/31 0357)  Labs: Recent Labs    05/18/24 1436 05/18/24 2232 05/19/24 0616 05/19/24 1348 05/19/24 1938 05/20/24 0423 05/20/24 2111 05/21/24 0530  HGB  --   --   --  11.2*  --  10.4*  --   --   HCT  --   --   --  34.3*  --  32.8*  --   --   PLT  --   --   --  160  --  158  --   --   APTT 31   < > 111* 68* 72*  --  65* 80*  HEPARINUNFRC >1.10*  --  >1.10*  --   --   --   --  0.54  CREATININE  --   --   --   --   --  1.82*  --   --    < > = values in this interval not displayed.    Estimated Creatinine Clearance: 53.6 mL/min (A) (by C-G formula based on SCr of 1.82 mg/dL (H)).  Medications:  Apixaban  PTA - last dose 1/27 in AM  Assessment: Patient admitted with abdominal pain and concern of small bowel obstruction. PMH includes a fib on Apixaban  PTA, hx of stroke in 2019, diabetes, hypertension, and sleep apnea.  Date Time Results Comments 1/28 1436 aPTT=31 HL > 1.10, baseline 1/28 2232 aPTT=52 Rate 1500u/h 1/29 0616 aPTT=110 HL >1.1 , rate 1850u/h 1/29 1348 aPTT= 68 Therapeutic x 1, rate 1650u/h 1/29 1938 aPTT = 72 Therapeutic x 2 1/29 1400 Procedure canceled. Resume heparin   1/30     2111    aPTT = 65       SUBtherapeutic  1/31     0530   aPTT = 80        Therapeutic X 1   Goal of Therapy:  Heparin  level 0.3-0.7 units/ml aPTT 66-102 seconds Monitor platelets by anticoagulation protocol: Yes    Plan:  1/31 @ 0530:  aPTT = 80,  HL = 0.54 - aPTT therapeutic X 1, now correlating with HL  - will use HL to guide dosing from here on - recheck HL in 6 hrs Continue to monitor H&H and platelets  Bless Lisenby  D 05/21/2024 6:52 AM

## 2024-05-22 ENCOUNTER — Inpatient Hospital Stay

## 2024-05-22 DIAGNOSIS — D539 Nutritional anemia, unspecified: Secondary | ICD-10-CM

## 2024-05-22 DIAGNOSIS — I10 Essential (primary) hypertension: Secondary | ICD-10-CM | POA: Diagnosis not present

## 2024-05-22 DIAGNOSIS — K56609 Unspecified intestinal obstruction, unspecified as to partial versus complete obstruction: Secondary | ICD-10-CM | POA: Diagnosis not present

## 2024-05-22 DIAGNOSIS — R109 Unspecified abdominal pain: Secondary | ICD-10-CM | POA: Diagnosis not present

## 2024-05-22 DIAGNOSIS — I480 Paroxysmal atrial fibrillation: Secondary | ICD-10-CM | POA: Diagnosis not present

## 2024-05-22 LAB — COMPREHENSIVE METABOLIC PANEL WITH GFR
ALT: 8 U/L (ref 0–44)
AST: 15 U/L (ref 15–41)
Albumin: 3.4 g/dL — ABNORMAL LOW (ref 3.5–5.0)
Alkaline Phosphatase: 63 U/L (ref 38–126)
Anion gap: 10 (ref 5–15)
BUN: 34 mg/dL — ABNORMAL HIGH (ref 8–23)
CO2: 26 mmol/L (ref 22–32)
Calcium: 9.4 mg/dL (ref 8.9–10.3)
Chloride: 102 mmol/L (ref 98–111)
Creatinine, Ser: 2.08 mg/dL — ABNORMAL HIGH (ref 0.61–1.24)
GFR, Estimated: 35 mL/min — ABNORMAL LOW
Glucose, Bld: 131 mg/dL — ABNORMAL HIGH (ref 70–99)
Potassium: 3.8 mmol/L (ref 3.5–5.1)
Sodium: 139 mmol/L (ref 135–145)
Total Bilirubin: 0.5 mg/dL (ref 0.0–1.2)
Total Protein: 6.6 g/dL (ref 6.5–8.1)

## 2024-05-22 LAB — HEPARIN LEVEL (UNFRACTIONATED): Heparin Unfractionated: 0.42 [IU]/mL (ref 0.30–0.70)

## 2024-05-22 LAB — GLUCOSE, CAPILLARY
Glucose-Capillary: 112 mg/dL — ABNORMAL HIGH (ref 70–99)
Glucose-Capillary: 127 mg/dL — ABNORMAL HIGH (ref 70–99)
Glucose-Capillary: 134 mg/dL — ABNORMAL HIGH (ref 70–99)
Glucose-Capillary: 130 mg/dL — ABNORMAL HIGH (ref 70–99)

## 2024-05-22 LAB — CBC
HCT: 31.6 % — ABNORMAL LOW (ref 39.0–52.0)
Hemoglobin: 10.3 g/dL — ABNORMAL LOW (ref 13.0–17.0)
MCH: 32.8 pg (ref 26.0–34.0)
MCHC: 32.6 g/dL (ref 30.0–36.0)
MCV: 100.6 fL — ABNORMAL HIGH (ref 80.0–100.0)
Platelets: 156 10*3/uL (ref 150–400)
RBC: 3.14 MIL/uL — ABNORMAL LOW (ref 4.22–5.81)
RDW: 13.2 % (ref 11.5–15.5)
WBC: 7 10*3/uL (ref 4.0–10.5)
nRBC: 0 % (ref 0.0–0.2)

## 2024-05-22 LAB — MAGNESIUM: Magnesium: 1.8 mg/dL (ref 1.7–2.4)

## 2024-05-22 MED ORDER — HEPARIN (PORCINE) 25000 UT/250ML-% IV SOLN
1850.0000 [IU]/h | INTRAVENOUS | Status: AC
  Administered 2024-05-22 (×3): 1850 [IU]/h via INTRAVENOUS
  Filled 2024-05-22 (×2): qty 250

## 2024-05-22 MED ORDER — SODIUM CHLORIDE 0.9 % IV SOLN
1.0000 g | Freq: Once | INTRAVENOUS | Status: DC
  Filled 2024-05-22: qty 1

## 2024-05-22 MED ORDER — IRON SUCROSE 200 MG IVPB - SIMPLE MED
200.0000 mg | Freq: Once | Status: AC
  Administered 2024-05-22: 200 mg via INTRAVENOUS
  Filled 2024-05-22: qty 200

## 2024-05-22 MED ORDER — CHLORHEXIDINE GLUCONATE CLOTH 2 % EX PADS
6.0000 | MEDICATED_PAD | Freq: Every day | CUTANEOUS | Status: DC
  Administered 2024-05-23 – 2024-05-24 (×2): 6 via TOPICAL

## 2024-05-22 NOTE — Progress Notes (Signed)
 " Progress Note   Patient: Erik [name] FMW:[id number] DOB: [birth year] DOA: 05/18/2024     4 DOS: the patient was seen and examined on 05/22/2024   Brief hospital course: Partly taken from H&P.  Erik [name] is a 64 y.o. male with medical history significant of a fib on oral anticoagulation, diabetes without complication, hypertension, sleep apnea comes to the emergency room with complaints of abdominal pain little over 24 hours which progressively worsening.  Patient has abdominal surgery for umbilical hernia repair in 1999 and subsequent ventral and umbilical hernia repair in 2004.  CT abdomen concerning for SBO with transition at the site of mesh.  NG tube was placed and general surgery was consulted.  1/29: Vital stable, KUB with persistent SBO, multiple dilated gas-filled loops with increasing caliber.  Gastrografin  studies today and then surgery decide about intervention.  1/30: Blood pressure elevated, potassium 3.3 with normal magnesium-being repleted.  Anemia panel consistent of anemia of chronic disease and some iron  deficiency.  Repeat imaging with some improvement in dilated bowel, started having bowel movements and passing flatus.  NG tube was pulled and he is being started on clear liquid diet.  1/31: Mildly elevated BP, repeat KUB with persistently dilated loops of small bowel, moderate amount of retained stool in the cecum and ascending colon to the hepatic flexure. Diet was advanced by surgery.  Patient wants to go home, not medically ready.  Assessment and Plan: * Small bowel obstruction (HCC) Persistent SBO, NG tube was removed at patient request. Surgery is on board. Currently on clear liquid diet and likely be going to the OR tomorrow morning. -Continue with supportive care  - Continue to monitor  Paroxysmal atrial fibrillation (HCC) Currently in A-fib with rate control. - Restarting p.o. home metoprolol  as patient is now on clear liquid diet -Home Eliquis   was switched with heparin  for anticipated surgery-  Hypertension Blood pressure mildly elevated. - Continue Home lisinopril , metoprolol  and HCTZ  -As needed hydralazine   Diabetes mellitus without complication (HCC) CBG within goal. - Continue with SSI  Chronic kidney disease (CKD), stage III (moderate) (HCC) Patient with underlying CKD stage III A. Creatinine seems to be around baseline. - Monitor renal function - Avoid nephrotoxins  Macrocytic anemia Hemoglobin at 10.3.  Anemia panel with some iron  deficiency.  Folate and B12 normal.  Patient was on Eliquis  and denies any obvious bleeding but need more screening as outpatient why he had anemia with some iron  deficiency at this age. -Ordered 1 dose of IV iron  as he will be going for surgery tomorrow   Subjective: Patient was sitting in chair when seen today.  Mild intermittent abdominal pain and nausea.  No obvious vomiting.  No BM.  Passed flatus.  Physical Exam: Vitals:   05/21/24 2046 05/22/24 0259 05/22/24 0423 05/22/24 1044  BP: (!) 156/93 110/72 110/72 (!) 137/106  Pulse: (!) 107 92 70 95  Resp: 17 18 18 18   Temp: 98.9 F (37.2 C) 99.7 F (37.6 C) 98.8 F (37.1 C) 98.2 F (36.8 C)  TempSrc: Oral Oral Oral   SpO2: 98% 97% 96% 99%  Weight:      Height:       General.  Obese gentleman, in no acute distress. Pulmonary.  Lungs clear bilaterally, normal respiratory effort. CV.  Regular rate and rhythm, no JVD, rub or murmur. Abdomen.  Soft, nontender, nondistended, BS hypoactive CNS.  Alert and oriented .  No focal neurologic deficit. Extremities.  No edema,  pulses intact and symmetrical.  Psychiatry.  Judgment and insight appears normal.   Data Reviewed: Prior data reviewed  Family Communication: Discussed with patient  Disposition: Status is: Inpatient Remains inpatient appropriate because: Severity of illness  Planned Discharge Destination: Home  DVT prophylaxis.  Heparin  infusion Time spent: 50  minutes  This record has been created using Conservation officer, historic buildings. Errors have been sought and corrected,but may not always be located. Such creation errors do not reflect on the standard of care.   Author: Amaryllis Dare, MD 05/22/2024 3:02 PM  For on call review [email or web address].  "

## 2024-05-22 NOTE — Progress Notes (Signed)
 CC: SBO Subjective: Emesis and nausea KUB show dilated loops SB Contrast SB  Objective: Vital signs in last 24 hours: Temp:  [98.8 F (37.1 C)-99.7 F (37.6 C)] 98.8 F (37.1 C) (02/01 0423) Pulse Rate:  [70-107] 70 (02/01 0423) Resp:  [17-18] 18 (02/01 0423) BP: (110-156)/(72-93) 110/72 (02/01 0423) SpO2:  [96 %-98 %] 96 % (02/01 0423) Last BM Date : 05/20/24  Intake/Output from previous day: 01/31 0701 - 02/01 0700 In: 759.1 [P.O.:360; I.V.:399.1] Out: -  Intake/Output this shift: No intake/output data recorded.  Physical exam: Wearing street clothes  NAD alert ABd: soft but distended, no peritonitis   Lab Results: CBC  Recent Labs    05/20/24 0423 05/22/24 0601  WBC 6.2 7.0  HGB 10.4* 10.3*  HCT 32.8* 31.6*  PLT 158 156   BMET Recent Labs    05/21/24 1255 05/22/24 0601  NA 139 139  K 4.0 3.8  CL 103 102  CO2 27 26  GLUCOSE 163* 131*  BUN 31* 34*  CREATININE 1.81* 2.08*  CALCIUM  9.3 9.4   PT/INR No results for input(s): LABPROT, INR in the last 72 hours. ABG No results for input(s): PHART, HCO3 in the last 72 hours.  Invalid input(s): PCO2, PO2  Studies/Results: DG ABD ACUTE 2+V W 1V CHEST Result Date: 05/21/2024 EXAM: Upright and Supine Xray Views of the Abdomen and 1 View(s) of the Chest 05/21/2024 10:53:00 AM COMPARISON: None available. CLINICAL HISTORY: Small bowel obstruction (HCC). FINDINGS: LUNGS AND PLEURA: No consolidation or pulmonary edema. No pleural effusion or pneumothorax. HEART AND MEDIASTINUM: No acute abnormality of the cardiac and mediastinal silhouettes. BOWEL: Persistent dilated loops of small bowel measuring up to 4 cm. Previously, these measured up to 4.6 cm. There is a moderate amount of retained stool identified within the cecum and ascending colon to the hepatic flexure. PERITONEUM AND SOFT TISSUES: No abnormal calcifications. No free air. BONES: Old healed right rib fractures. No acute osseous abnormality.  VASCULATURE: Aortic atherosclerosis. IMPRESSION: 1. Persistent dilated loops of small bowel measuring up to 4 cm, previously 4.6 cm, consistent with small bowel obstruction. 2. Moderate amount of retained stool in the cecum and ascending colon to the hepatic flexure. Electronically signed by: Waddell Calk MD 05/21/2024 01:50 PM EST RP Workstation: [id number]    Anti-infectives: Anti-infectives (From admission, onward)    None       Assessment/Plan: Recurrent intermittent SBO  Although passed gastrografin  he is not really improving clinically D/W the pt and wife in detail about clinical findings,  I also d/w Dr Marinda and the is to perform laparotomy tomorrow, hold heparin  for 4 hrs for potential intervention Extensive d/w pt and family about potential procedure, R, B and possible complications  I personally spent a total of 50 minutes in the care of the patient today including performing a medically appropriate exam/evaluation, counseling and educating, placing orders, referring and communicating with other health care professionals, documenting clinical information in the EHR, independently interpreting and reviewing images studies and coordinating care.     Laneta Luna, MD, [facility]  05/22/2024

## 2024-05-22 NOTE — Assessment & Plan Note (Signed)
 Hemoglobin at 10.3.  Anemia panel with some iron  deficiency.  Folate and B12 normal.  Patient was on Eliquis  and denies any obvious bleeding but need more screening as outpatient why he had anemia with some iron  deficiency at this age. -Ordered 1 dose of IV iron  as he will be going for surgery tomorrow

## 2024-05-22 NOTE — Consult Note (Signed)
 PHARMACY - ANTICOAGULATION CONSULT NOTE  Pharmacy Consult for heparin  infusion Indication: atrial fibrillation  Patient Measurements: Height: 6' (182.9 cm) Weight: 108.9 kg (240 lb) IBW/kg (Calculated) : 77.6 HEPARIN  DW (KG): 100.6  Vital Signs: Temp: 98.8 F (37.1 C) (02/01 0423) Temp Source: Oral (02/01 0423) BP: 110/72 (02/01 0423) Pulse Rate: 70 (02/01 0423)  Labs: Recent Labs    05/19/24 1348 05/19/24 1938 05/20/24 0423 05/20/24 2111 05/21/24 0530 05/21/24 1255 05/22/24 0601  HGB 11.2*  --  10.4*  --   --   --  10.3*  HCT 34.3*  --  32.8*  --   --   --  31.6*  PLT 160  --  158  --   --   --  156  APTT 68* 72*  --  65* 80*  --   --   HEPARINUNFRC  --   --   --   --  0.54 0.48 0.42  CREATININE  --   --  1.82*  --   --  1.81*  --     Estimated Creatinine Clearance: 53.9 mL/min (A) (by C-G formula based on SCr of 1.81 mg/dL (H)).  Medications:  Apixaban  PTA - last dose 1/27 in AM  Assessment: Patient admitted with abdominal pain and concern of small bowel obstruction. PMH includes a fib on Apixaban  PTA, hx of stroke in 2019, diabetes, hypertension, and sleep apnea.  Goal of Therapy:  Heparin  level 0.3-0.7 units/ml Monitor platelets by anticoagulation protocol: Yes    Plan:  2/1:  HL @ 0601 = 0.42, therapeutic X 3 - Will continue pt on current rate and recheck HL on 2/2 with AM labs. - Continue to monitor H&H and platelets  Faylinn Schwenn D 05/22/2024 7:13 AM

## 2024-05-23 ENCOUNTER — Encounter: Admission: EM | Disposition: A | Payer: Self-pay | Source: Home / Self Care | Attending: Internal Medicine

## 2024-05-23 ENCOUNTER — Inpatient Hospital Stay

## 2024-05-23 ENCOUNTER — Encounter: Payer: Self-pay | Admitting: Internal Medicine

## 2024-05-23 DIAGNOSIS — K56609 Unspecified intestinal obstruction, unspecified as to partial versus complete obstruction: Secondary | ICD-10-CM | POA: Diagnosis not present

## 2024-05-23 LAB — GLUCOSE, CAPILLARY
Glucose-Capillary: 154 mg/dL — ABNORMAL HIGH (ref 70–99)
Glucose-Capillary: 142 mg/dL — ABNORMAL HIGH (ref 70–99)
Glucose-Capillary: 155 mg/dL — ABNORMAL HIGH (ref 70–99)
Glucose-Capillary: 149 mg/dL — ABNORMAL HIGH (ref 70–99)

## 2024-05-23 LAB — CBC
HCT: 35.9 % — ABNORMAL LOW (ref 39.0–52.0)
Hemoglobin: 11.6 g/dL — ABNORMAL LOW (ref 13.0–17.0)
MCH: 32.1 pg (ref 26.0–34.0)
MCHC: 32.3 g/dL (ref 30.0–36.0)
MCV: 99.4 fL (ref 80.0–100.0)
Platelets: 160 10*3/uL (ref 150–400)
RBC: 3.61 MIL/uL — ABNORMAL LOW (ref 4.22–5.81)
RDW: 13.2 % (ref 11.5–15.5)
WBC: 8.5 10*3/uL (ref 4.0–10.5)
nRBC: 0 % (ref 0.0–0.2)

## 2024-05-23 MED ORDER — FENTANYL CITRATE (PF) 100 MCG/2ML IJ SOLN
INTRAMUSCULAR | Status: AC
  Filled 2024-05-23: qty 2

## 2024-05-23 MED ORDER — KETOROLAC TROMETHAMINE 30 MG/ML IJ SOLN
INTRAMUSCULAR | Status: DC | PRN
  Administered 2024-05-23: 30 mg via INTRAVENOUS

## 2024-05-23 MED ORDER — LACTATED RINGERS IV SOLN: INTRAVENOUS | Status: DC

## 2024-05-23 MED ORDER — SUGAMMADEX SODIUM 200 MG/2ML IV SOLN
INTRAVENOUS | Status: DC | PRN
  Administered 2024-05-23 (×2): 200 mg via INTRAVENOUS

## 2024-05-23 MED ORDER — KETOROLAC TROMETHAMINE 30 MG/ML IJ SOLN
INTRAMUSCULAR | Status: AC
  Filled 2024-05-23: qty 1

## 2024-05-23 MED ORDER — HYDRALAZINE HCL 20 MG/ML IJ SOLN: 5.0000 mg | Freq: Four times a day (QID) | INTRAMUSCULAR | Status: DC | PRN

## 2024-05-23 MED ORDER — DEXAMETHASONE SOD PHOSPHATE PF 10 MG/ML IJ SOLN
INTRAMUSCULAR | Status: AC
  Filled 2024-05-23: qty 1

## 2024-05-23 MED ORDER — PROPOFOL 10 MG/ML IV BOLUS
INTRAVENOUS | Status: DC | PRN
  Administered 2024-05-23: 200 mg via INTRAVENOUS

## 2024-05-23 MED ORDER — SODIUM CHLORIDE 0.9 % IV SOLN
2.0000 g | Freq: Once | INTRAVENOUS | Status: AC
  Administered 2024-05-23: 2 g via INTRAVENOUS

## 2024-05-23 MED ORDER — SODIUM CHLORIDE (PF) 0.9 % IJ SOLN
INTRAMUSCULAR | Status: AC
  Filled 2024-05-23: qty 40

## 2024-05-23 MED ORDER — ONDANSETRON HCL 4 MG/2ML IJ SOLN
INTRAMUSCULAR | Status: DC | PRN
  Administered 2024-05-23: 4 mg via INTRAVENOUS

## 2024-05-23 MED ORDER — MIDAZOLAM HCL 2 MG/2ML IJ SOLN
INTRAMUSCULAR | Status: AC
  Filled 2024-05-23: qty 2

## 2024-05-23 MED ORDER — ONDANSETRON HCL 4 MG/2ML IJ SOLN: 4.0000 mg | Freq: Four times a day (QID) | INTRAMUSCULAR | Status: DC | PRN

## 2024-05-23 MED ORDER — PHENYLEPHRINE HCL-NACL 20-0.9 MG/250ML-% IV SOLN
INTRAVENOUS | Status: AC
  Filled 2024-05-23: qty 250

## 2024-05-23 MED ORDER — HEPARIN (PORCINE) 25000 UT/250ML-% IV SOLN
1850.0000 [IU]/h | INTRAVENOUS | Status: AC
  Administered 2024-05-23 – 2024-05-24 (×3): 1850 [IU]/h via INTRAVENOUS
  Filled 2024-05-23 (×2): qty 250

## 2024-05-23 MED ORDER — PHENYLEPHRINE HCL-NACL 20-0.9 MG/250ML-% IV SOLN
INTRAVENOUS | Status: DC | PRN
  Administered 2024-05-23: 60 ug/min via INTRAVENOUS

## 2024-05-23 MED ORDER — LIDOCAINE HCL (CARDIAC) PF 100 MG/5ML IV SOSY
PREFILLED_SYRINGE | INTRAVENOUS | Status: DC | PRN
  Administered 2024-05-23: 80 mg via INTRAVENOUS

## 2024-05-23 MED ORDER — METOPROLOL TARTRATE 50 MG PO TABS
50.0000 mg | ORAL_TABLET | Freq: Two times a day (BID) | ORAL | Status: DC
  Administered 2024-05-24 – 2024-05-25 (×3): 50 mg
  Filled 2024-05-23 (×3): qty 1

## 2024-05-23 MED ORDER — HYDROMORPHONE HCL 1 MG/ML IJ SOLN
INTRAMUSCULAR | Status: DC | PRN
  Administered 2024-05-23 (×2): .5 mg via INTRAVENOUS

## 2024-05-23 MED ORDER — METHOCARBAMOL 500 MG PO TABS
500.0000 mg | ORAL_TABLET | Freq: Four times a day (QID) | ORAL | Status: DC | PRN
  Administered 2024-05-23 – 2024-05-25 (×6): 500 mg
  Filled 2024-05-23 (×6): qty 1

## 2024-05-23 MED ORDER — LISINOPRIL 20 MG PO TABS
20.0000 mg | ORAL_TABLET | Freq: Every day | ORAL | Status: DC
  Administered 2024-05-24: 20 mg
  Filled 2024-05-23: qty 1

## 2024-05-23 MED ORDER — HYDROMORPHONE HCL 1 MG/ML IJ SOLN
INTRAMUSCULAR | Status: AC
  Filled 2024-05-23: qty 1

## 2024-05-23 MED ORDER — ROCURONIUM BROMIDE 10 MG/ML (PF) SYRINGE
PREFILLED_SYRINGE | INTRAVENOUS | Status: AC
  Filled 2024-05-23: qty 20

## 2024-05-23 MED ORDER — BUPIVACAINE-EPINEPHRINE (PF) 0.5% -1:200000 IJ SOLN
INTRAMUSCULAR | Status: AC
  Filled 2024-05-23: qty 30

## 2024-05-23 MED ORDER — FENTANYL CITRATE (PF) 100 MCG/2ML IJ SOLN
INTRAMUSCULAR | Status: DC | PRN
  Administered 2024-05-23: 100 ug via INTRAVENOUS

## 2024-05-23 MED ORDER — ONDANSETRON HCL 4 MG/2ML IJ SOLN
INTRAMUSCULAR | Status: AC
  Filled 2024-05-23: qty 2

## 2024-05-23 MED ORDER — SODIUM CHLORIDE 0.9 % IV SOLN
INTRAVENOUS | Status: AC
  Filled 2024-05-23: qty 2

## 2024-05-23 MED ORDER — TOPIRAMATE 25 MG PO TABS
50.0000 mg | ORAL_TABLET | Freq: Every day | ORAL | Status: DC
  Administered 2024-05-24 – 2024-05-25 (×2): 50 mg
  Filled 2024-05-23 (×2): qty 2

## 2024-05-23 MED ORDER — ONDANSETRON HCL 4 MG PO TABS: 4.0000 mg | ORAL_TABLET | Freq: Four times a day (QID) | ORAL | Status: DC | PRN

## 2024-05-23 MED ORDER — HYDROCHLOROTHIAZIDE 12.5 MG PO TABS
12.5000 mg | ORAL_TABLET | Freq: Every day | ORAL | Status: DC
  Administered 2024-05-24: 12.5 mg
  Filled 2024-05-23: qty 1

## 2024-05-23 MED ORDER — SODIUM CHLORIDE (PF) 0.9 % IJ SOLN
INTRAMUSCULAR | Status: DC | PRN
  Administered 2024-05-23: 80 mL

## 2024-05-23 MED ORDER — ACETAMINOPHEN 10 MG/ML IV SOLN
1000.0000 mg | Freq: Four times a day (QID) | INTRAVENOUS | Status: AC
  Administered 2024-05-23 – 2024-05-24 (×4): 1000 mg via INTRAVENOUS
  Filled 2024-05-23 (×4): qty 100

## 2024-05-23 MED ORDER — PROMETHAZINE HCL 25 MG/ML IJ SOLN: 12.5000 mg | Freq: Once | INTRAMUSCULAR | Status: DC | PRN

## 2024-05-23 MED ORDER — PHENYLEPHRINE 80 MCG/ML (10ML) SYRINGE FOR IV PUSH (FOR BLOOD PRESSURE SUPPORT)
PREFILLED_SYRINGE | INTRAVENOUS | Status: DC | PRN
  Administered 2024-05-23: 160 ug via INTRAVENOUS
  Administered 2024-05-23: 400 ug via INTRAVENOUS
  Administered 2024-05-23: 240 ug via INTRAVENOUS
  Administered 2024-05-23 (×2): 160 ug via INTRAVENOUS
  Administered 2024-05-23: 240 ug via INTRAVENOUS

## 2024-05-23 MED ORDER — PROPOFOL 10 MG/ML IV BOLUS
INTRAVENOUS | Status: AC
  Filled 2024-05-23: qty 20

## 2024-05-23 MED ORDER — 0.9 % SODIUM CHLORIDE (POUR BTL) OPTIME
TOPICAL | Status: DC | PRN
  Administered 2024-05-23: 1000 mL

## 2024-05-23 MED ORDER — HYDROMORPHONE HCL 1 MG/ML IJ SOLN: 0.2500 mg | INTRAMUSCULAR | Status: DC | PRN

## 2024-05-23 MED ORDER — LIDOCAINE HCL (PF) 2 % IJ SOLN
INTRAMUSCULAR | Status: AC
  Filled 2024-05-23: qty 5

## 2024-05-23 MED ORDER — DEXAMETHASONE SOD PHOSPHATE PF 10 MG/ML IJ SOLN
INTRAMUSCULAR | Status: DC | PRN
  Administered 2024-05-23: 5 mg via INTRAVENOUS

## 2024-05-23 MED ORDER — BUPIVACAINE LIPOSOME 1.3 % IJ SUSP
INTRAMUSCULAR | Status: AC
  Filled 2024-05-23: qty 20

## 2024-05-23 MED ORDER — PHENYLEPHRINE 80 MCG/ML (10ML) SYRINGE FOR IV PUSH (FOR BLOOD PRESSURE SUPPORT)
PREFILLED_SYRINGE | INTRAVENOUS | Status: AC
  Filled 2024-05-23: qty 20

## 2024-05-23 MED ORDER — MIDAZOLAM HCL (PF) 2 MG/2ML IJ SOLN
INTRAMUSCULAR | Status: DC | PRN
  Administered 2024-05-23: 2 mg via INTRAVENOUS

## 2024-05-23 MED ORDER — ROCURONIUM BROMIDE 100 MG/10ML IV SOLN
INTRAVENOUS | Status: DC | PRN
  Administered 2024-05-23 (×2): 20 mg via INTRAVENOUS
  Administered 2024-05-23 (×2): 50 mg via INTRAVENOUS

## 2024-05-23 NOTE — Progress Notes (Signed)
 Patient is status post exploratory laparotomy with lysis of adhesions, mesh explantation and repair of recurrent hernia.  Expecting him to have an ileus.  For now continue NG tube to low intermittent wall suction.  Recommend getting PICC line for TPN.  Okay to restart heparin  8 hours after procedure which would be at 8 PM tonight.  As needed pain control.

## 2024-05-23 NOTE — Addendum Note (Signed)
 Addendum  created 05/23/24 1304 by Veronica Alm BROCKS, CRNA   LDA properties accepted

## 2024-05-23 NOTE — Anesthesia Preprocedure Evaluation (Signed)
"                                    Anesthesia Evaluation  Patient identified by MRN, date of birth, ID band Patient awake    Reviewed: reviewed documented beta blocker date and time   Airway Mallampati: II  TM Distance: >3 FB Neck ROM: Full    Dental no notable dental hx.    Pulmonary sleep apnea , former smoker   Pulmonary exam normal breath sounds clear to auscultation       Cardiovascular hypertension, + Orthopnea  Normal cardiovascular exam+ dysrhythmias II Rhythm:Regular Rate:Normal     Neuro/Psych CVA    GI/Hepatic   Endo/Other  diabetes    Renal/GU Renal disease     Musculoskeletal  (+) Arthritis ,    Abdominal   Peds  Hematology  (+) Blood dyscrasia, anemia   Anesthesia Other Findings   Reproductive/Obstetrics                              Anesthesia Physical Anesthesia Plan  ASA: 3  Anesthesia Plan: General   Post-op Pain Management:    Induction: Intravenous and Inhalational  PONV Risk Score and Plan: 1 and Ondansetron  and Dexamethasone   Airway Management Planned: Oral ETT  Additional Equipment:   Intra-op Plan:   Post-operative Plan:   Informed Consent: I have reviewed the patients History and Physical, chart, labs and discussed the procedure including the risks, benefits and alternatives for the proposed anesthesia with the patient or authorized representative who has indicated his/her understanding and acceptance.       Plan Discussed with: CRNA  Anesthesia Plan Comments:         Anesthesia Quick Evaluation  "

## 2024-05-23 NOTE — Anesthesia Postprocedure Evaluation (Signed)
"   Anesthesia Post Note  Patient: Rojelio Chiou  Procedure(s) Performed: LAPAROTOMY, EXPLORATORY LAPAROTOMY, FOR LYSIS OF ADHESIONS REMOVAL, MESH, ABDOMEN OR PELVIS  Patient location during evaluation: PACU Anesthesia Type: General Level of consciousness: awake Pain management: pain level controlled Vital Signs Assessment: post-procedure vital signs reviewed and stable Respiratory status: spontaneous breathing Cardiovascular status: blood pressure returned to baseline and stable Postop Assessment: no apparent nausea or vomiting Anesthetic complications: no   No notable events documented.   Last Vitals:  Vitals:   05/23/24 1228 05/23/24 1230  BP: (!) 169/101 (!) 174/106  Pulse: (!) 126 (!) 107  Resp: 18 (!) 27  Temp:    SpO2: 100% 99%    Last Pain:  Vitals:   05/23/24 0851  TempSrc: Temporal  PainSc: 9                  Redell MARLA Breaker      "

## 2024-05-23 NOTE — Plan of Care (Signed)

## 2024-05-23 NOTE — Consult Note (Signed)
 PHARMACY - ANTICOAGULATION CONSULT NOTE  Pharmacy Consult for heparin  infusion Indication: atrial fibrillation  Patient Measurements: Height: 6' (182.9 cm) Weight: 108.9 kg (240 lb) IBW/kg (Calculated) : 77.6 HEPARIN  DW (KG): 100.6  Vital Signs: Temp: 97.2 F (36.2 C) (02/02 1228) Temp Source: Temporal (02/02 0851) BP: 147/89 (02/02 1300) Pulse Rate: 103 (02/02 1300)  Labs: Recent Labs    05/20/24 2111 05/21/24 0530 05/21/24 1255 05/22/24 0601 05/23/24 0432  HGB  --   --   --  10.3* 11.6*  HCT  --   --   --  31.6* 35.9*  PLT  --   --   --  156 160  APTT 65* 80*  --   --   --   HEPARINUNFRC  --  0.54 0.48 0.42  --   CREATININE  --   --  1.81* 2.08*  --     Estimated Creatinine Clearance: 46.9 mL/min (A) (by C-G formula based on SCr of 2.08 mg/dL (H)).  Medications:  Apixaban  PTA - last dose 1/27 in AM  Assessment: Patient admitted with abdominal pain and concern of small bowel obstruction. PMH includes a fib on Apixaban  PTA, hx of stroke in 2019, diabetes, hypertension, and sleep apnea.  2/2 PM: per surgery, OK to restart heparin  8 hours after procedure at 8 PM tonight  Goal of Therapy:  Heparin  level 0.3-0.7 units/ml Monitor platelets by anticoagulation protocol: Yes   Plan:  - Resume heparin  infusion at 2000 at 1850 units/hr - Anti-Xa level 6 hours after resuming infusion  - Continue to monitor H&H and platelets  Bari Hamilton D 05/23/2024 1:39 PM

## 2024-05-23 NOTE — Progress Notes (Signed)
 Patient returned from pacu NG tube present , foley intact and draining well. Patient request muscle relaxer , surgical incision midline with scant amount of drainage.   05/23/24 1353  Vitals  Temp 98 F (36.7 C)  BP (!) 155/87  Pulse Rate 98  ECG Heart Rate 98  Resp 20  Level of Consciousness  Level of Consciousness Alert  MEWS COLOR  MEWS Score Color Green  Oxygen Therapy  SpO2 96 %  O2 Device Room Air  MEWS Score  MEWS Temp 0  MEWS Systolic 0  MEWS Pulse 0  MEWS RR 0  MEWS LOC 0  MEWS Score 0

## 2024-05-23 NOTE — Progress Notes (Signed)
 Patient with persistent small bowel obstruction that is partial.  Still dilated bowel and he says he feels bad.  He has failed nonoperative management of the small bowel obstruction.  We will plan for exploratory laparotomy, lysis of adhesions.  I also discussed with him that we may need to remove some of his mesh.  I discussed risk benefits alternatives of procedure including risk of infection, bleeding, need for small bowel obstruction and if so anastomotic leak, and enterocutaneous fistula development and possible need for leaving the patient open and returning to the OR at a later date.  He understands all these risk and wishes to proceed with surgery

## 2024-05-24 ENCOUNTER — Encounter: Payer: Self-pay | Admitting: General Surgery

## 2024-05-24 DIAGNOSIS — N179 Acute kidney failure, unspecified: Secondary | ICD-10-CM

## 2024-05-24 DIAGNOSIS — K56609 Unspecified intestinal obstruction, unspecified as to partial versus complete obstruction: Secondary | ICD-10-CM | POA: Diagnosis not present

## 2024-05-24 DIAGNOSIS — T300 Burn of unspecified body region, unspecified degree: Secondary | ICD-10-CM | POA: Insufficient documentation

## 2024-05-24 LAB — COMPREHENSIVE METABOLIC PANEL WITH GFR
ALT: 7 U/L (ref 0–44)
AST: 12 U/L — ABNORMAL LOW (ref 15–41)
Albumin: 2.9 g/dL — ABNORMAL LOW (ref 3.5–5.0)
Alkaline Phosphatase: 51 U/L (ref 38–126)
Anion gap: 11 (ref 5–15)
BUN: 48 mg/dL — ABNORMAL HIGH (ref 8–23)
CO2: 29 mmol/L (ref 22–32)
Calcium: 8.8 mg/dL — ABNORMAL LOW (ref 8.9–10.3)
Chloride: 104 mmol/L (ref 98–111)
Creatinine, Ser: 2.76 mg/dL — ABNORMAL HIGH (ref 0.61–1.24)
GFR, Estimated: 25 mL/min — ABNORMAL LOW
Glucose, Bld: 149 mg/dL — ABNORMAL HIGH (ref 70–99)
Potassium: 3.6 mmol/L (ref 3.5–5.1)
Sodium: 144 mmol/L (ref 135–145)
Total Bilirubin: 0.3 mg/dL (ref 0.0–1.2)
Total Protein: 5.6 g/dL — ABNORMAL LOW (ref 6.5–8.1)

## 2024-05-24 LAB — GLUCOSE, CAPILLARY
Glucose-Capillary: 126 mg/dL — ABNORMAL HIGH (ref 70–99)
Glucose-Capillary: 109 mg/dL — ABNORMAL HIGH (ref 70–99)
Glucose-Capillary: 126 mg/dL — ABNORMAL HIGH (ref 70–99)
Glucose-Capillary: 134 mg/dL — ABNORMAL HIGH (ref 70–99)
Glucose-Capillary: 117 mg/dL — ABNORMAL HIGH (ref 70–99)

## 2024-05-24 LAB — CBC WITH DIFFERENTIAL/PLATELET
Abs Immature Granulocytes: 0.05 10*3/uL (ref 0.00–0.07)
Basophils Absolute: 0 10*3/uL (ref 0.0–0.1)
Basophils Relative: 0 %
Eosinophils Absolute: 0 10*3/uL (ref 0.0–0.5)
Eosinophils Relative: 1 %
HCT: 30.2 % — ABNORMAL LOW (ref 39.0–52.0)
Hemoglobin: 10 g/dL — ABNORMAL LOW (ref 13.0–17.0)
Immature Granulocytes: 1 %
Lymphocytes Relative: 13 %
Lymphs Abs: 1.1 10*3/uL (ref 0.7–4.0)
MCH: 33 pg (ref 26.0–34.0)
MCHC: 33.1 g/dL (ref 30.0–36.0)
MCV: 99.7 fL (ref 80.0–100.0)
Monocytes Absolute: 1.1 10*3/uL — ABNORMAL HIGH (ref 0.1–1.0)
Monocytes Relative: 13 %
Neutro Abs: 6 10*3/uL (ref 1.7–7.7)
Neutrophils Relative %: 72 %
Platelets: 148 10*3/uL — ABNORMAL LOW (ref 150–400)
RBC: 3.03 MIL/uL — ABNORMAL LOW (ref 4.22–5.81)
RDW: 13.4 % (ref 11.5–15.5)
Smear Review: NORMAL
WBC: 8.3 10*3/uL (ref 4.0–10.5)
nRBC: 0 % (ref 0.0–0.2)

## 2024-05-24 LAB — HEPARIN LEVEL (UNFRACTIONATED)
Heparin Unfractionated: 0.39 [IU]/mL (ref 0.30–0.70)
Heparin Unfractionated: 0.45 [IU]/mL (ref 0.30–0.70)

## 2024-05-24 MED ORDER — ACETAMINOPHEN 10 MG/ML IV SOLN
1000.0000 mg | Freq: Four times a day (QID) | INTRAVENOUS | Status: AC
  Administered 2024-05-24 – 2024-05-25 (×4): 1000 mg via INTRAVENOUS
  Filled 2024-05-24 (×4): qty 100

## 2024-05-24 MED ORDER — LACTATED RINGERS IV SOLN: INTRAVENOUS | Status: DC

## 2024-05-24 NOTE — Consult Note (Signed)
 PHARMACY - ANTICOAGULATION CONSULT NOTE  Pharmacy Consult for heparin  infusion Indication: atrial fibrillation  Patient Measurements: Height: 6' (182.9 cm) Weight: 108.9 kg (240 lb) IBW/kg (Calculated) : 77.6 HEPARIN  DW (KG): 100.6  Vital Signs: Temp: 99.3 F (37.4 C) (02/02 2013) BP: 130/79 (02/02 2013) Pulse Rate: 104 (02/02 2013)  Labs: Recent Labs    05/21/24 0530 05/21/24 1255 05/22/24 0601 05/22/24 0601 05/23/24 0432 05/24/24 0233  HGB  --   --  10.3*   < > 11.6* 10.0*  HCT  --   --  31.6*  --  35.9* 30.2*  PLT  --   --  156  --  160 148*  APTT 80*  --   --   --   --   --   HEPARINUNFRC 0.54 0.48 0.42  --   --  0.39  CREATININE  --  1.81* 2.08*  --   --  2.76*   < > = values in this interval not displayed.    Estimated Creatinine Clearance: 35.4 mL/min (A) (by C-G formula based on SCr of 2.76 mg/dL (H)).  Medications:  Apixaban  PTA - last dose 1/27 in AM  Assessment: Patient admitted with abdominal pain and concern of small bowel obstruction. PMH includes a fib on Apixaban  PTA, hx of stroke in 2019, diabetes, hypertension, and sleep apnea.  2/2 PM: per surgery, OK to restart heparin  8 hours after procedure at 8 PM tonight  Goal of Therapy:  Heparin  level 0.3-0.7 units/ml Monitor platelets by anticoagulation protocol: Yes   Plan:  - HL therapeutic x 1 after restart - Continue heparin  infusion at 1850 units/hr - Recheck HL in 6 hours to confirm  - Continue to monitor H&H and platelets  Rankin CANDIE Dills, PharmD, [facility] 05/24/2024 3:41 AM
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
## 2024-05-24 NOTE — Assessment & Plan Note (Signed)
 05/24/24 Serum creatinine is 2.76/eGFR 25 from baseline CKD stage IIIb Suspect prerenal in setting of poor p.o. intake Strict I's and O's LR infusion at 125 mL/h, 1 day ordered Recheck BMP in the a.m.
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
## 2024-05-24 NOTE — Consult Note (Signed)
 PHARMACY - ANTICOAGULATION CONSULT NOTE  Pharmacy Consult for heparin  infusion Indication: atrial fibrillation  Patient Measurements: Height: 6' (182.9 cm) Weight: 108.9 kg (240 lb) IBW/kg (Calculated) : 77.6 HEPARIN  DW (KG): 100.6  Vital Signs: Temp: 99.7 F (37.6 C) (02/03 0920) Temp Source: Oral (02/03 0504) BP: 124/74 (02/03 0920) Pulse Rate: 85 (02/03 0920)  Labs: Recent Labs    05/21/24 1255 05/21/24 1255 05/22/24 0601 05/23/24 0432 05/24/24 0233 05/24/24 0847  HGB  --    < > 10.3* 11.6* 10.0*  --   HCT  --   --  31.6* 35.9* 30.2*  --   PLT  --   --  156 160 148*  --   HEPARINUNFRC 0.48  --  0.42  --  0.39 0.45  CREATININE 1.81*  --  2.08*  --  2.76*  --    < > = values in this interval not displayed.    Estimated Creatinine Clearance: 35.4 mL/min (A) (by C-G formula based on SCr of 2.76 mg/dL (H)).  Medications:  Apixaban  PTA - last dose 1/27 in AM  Assessment: Patient admitted with abdominal pain and concern of small bowel obstruction. PMH includes a fib on Apixaban  PTA, hx of stroke in 2019, diabetes, hypertension, and sleep apnea.  2/2 PM: per surgery, OK to restart heparin  8 hours after procedure at 8 PM tonight  2/3 @ 0847 HL = 0.45  Goal of Therapy:  Heparin  level 0.3-0.7 units/ml Monitor platelets by anticoagulation protocol: Yes   Plan:  - HL therapeutic x 2 after restart - Continue heparin  infusion at 1850 units/hr - Daily HL - Continue to monitor H&H and platelets  Bari Glendia BIRCH, PharmD, BCPS  05/24/2024 10:05 AM

## 2024-05-25 DIAGNOSIS — K56609 Unspecified intestinal obstruction, unspecified as to partial versus complete obstruction: Secondary | ICD-10-CM | POA: Diagnosis not present

## 2024-05-25 LAB — CBC
HCT: 28.4 % — ABNORMAL LOW (ref 39.0–52.0)
Hemoglobin: 9.2 g/dL — ABNORMAL LOW (ref 13.0–17.0)
MCH: 32.6 pg (ref 26.0–34.0)
MCHC: 32.4 g/dL (ref 30.0–36.0)
MCV: 100.7 fL — ABNORMAL HIGH (ref 80.0–100.0)
Platelets: 142 10*3/uL — ABNORMAL LOW (ref 150–400)
RBC: 2.82 MIL/uL — ABNORMAL LOW (ref 4.22–5.81)
RDW: 13.6 % (ref 11.5–15.5)
WBC: 9.2 10*3/uL (ref 4.0–10.5)
nRBC: 0 % (ref 0.0–0.2)

## 2024-05-25 LAB — BASIC METABOLIC PANEL WITH GFR
Anion gap: 13 (ref 5–15)
BUN: 45 mg/dL — ABNORMAL HIGH (ref 8–23)
CO2: 30 mmol/L (ref 22–32)
Calcium: 9 mg/dL (ref 8.9–10.3)
Chloride: 101 mmol/L (ref 98–111)
Creatinine, Ser: 2.43 mg/dL — ABNORMAL HIGH (ref 0.61–1.24)
GFR, Estimated: 29 mL/min — ABNORMAL LOW
Glucose, Bld: 119 mg/dL — ABNORMAL HIGH (ref 70–99)
Potassium: 3.5 mmol/L (ref 3.5–5.1)
Sodium: 144 mmol/L (ref 135–145)

## 2024-05-25 LAB — SURGICAL PATHOLOGY

## 2024-05-25 LAB — HEPARIN LEVEL (UNFRACTIONATED): Heparin Unfractionated: 0.36 [IU]/mL (ref 0.30–0.70)

## 2024-05-25 LAB — GLUCOSE, CAPILLARY
Glucose-Capillary: 118 mg/dL — ABNORMAL HIGH (ref 70–99)
Glucose-Capillary: 113 mg/dL — ABNORMAL HIGH (ref 70–99)
Glucose-Capillary: 124 mg/dL — ABNORMAL HIGH (ref 70–99)
Glucose-Capillary: 106 mg/dL — ABNORMAL HIGH (ref 70–99)

## 2024-05-25 MED ORDER — MORPHINE SULFATE (PF) 2 MG/ML IV SOLN
1.0000 mg | INTRAVENOUS | Status: DC | PRN
  Administered 2024-05-25 – 2024-05-26 (×5): 2 mg via INTRAVENOUS
  Filled 2024-05-25 (×5): qty 1

## 2024-05-25 MED ORDER — METHOCARBAMOL 500 MG PO TABS
500.0000 mg | ORAL_TABLET | Freq: Four times a day (QID) | ORAL | Status: AC | PRN
  Administered 2024-05-25: 500 mg via ORAL
  Filled 2024-05-25: qty 1

## 2024-05-25 MED ORDER — METOPROLOL TARTRATE 50 MG PO TABS
50.0000 mg | ORAL_TABLET | Freq: Two times a day (BID) | ORAL | Status: DC
  Administered 2024-05-25 – 2024-05-26 (×2): 50 mg via ORAL
  Filled 2024-05-25 (×2): qty 1

## 2024-05-25 MED ORDER — APIXABAN 5 MG PO TABS
5.0000 mg | ORAL_TABLET | Freq: Two times a day (BID) | ORAL | Status: DC
  Administered 2024-05-25 – 2024-05-26 (×3): 5 mg via ORAL
  Filled 2024-05-25 (×3): qty 1

## 2024-05-25 MED ORDER — METHOCARBAMOL 500 MG PO TABS
500.0000 mg | ORAL_TABLET | Freq: Four times a day (QID) | ORAL | Status: AC | PRN
  Filled 2024-05-25: qty 1

## 2024-05-25 MED ORDER — TOPIRAMATE 25 MG PO TABS
50.0000 mg | ORAL_TABLET | Freq: Every day | ORAL | Status: DC
  Administered 2024-05-26: 50 mg via ORAL
  Filled 2024-05-25: qty 2

## 2024-05-25 MED ORDER — ONDANSETRON HCL 4 MG PO TABS: 4.0000 mg | ORAL_TABLET | Freq: Four times a day (QID) | ORAL | Status: DC | PRN

## 2024-05-25 MED ORDER — HEPARIN (PORCINE) 25000 UT/250ML-% IV SOLN
1850.0000 [IU]/h | INTRAVENOUS | Status: DC
  Administered 2024-05-25: 1850 [IU]/h via INTRAVENOUS
  Filled 2024-05-25: qty 250

## 2024-05-25 MED ORDER — ONDANSETRON HCL 4 MG/2ML IJ SOLN: 4.0000 mg | Freq: Four times a day (QID) | INTRAMUSCULAR | Status: DC | PRN

## 2024-05-25 MED ORDER — OXYCODONE-ACETAMINOPHEN 5-325 MG PO TABS
1.0000 | ORAL_TABLET | Freq: Four times a day (QID) | ORAL | Status: DC | PRN
  Administered 2024-05-25 – 2024-05-26 (×3): 1 via ORAL
  Filled 2024-05-25 (×3): qty 1
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
## 2024-05-25 NOTE — Progress Notes (Signed)
 Mobility Specialist Progress Note:    05/25/24 1141  Mobility  Activity Ambulated independently  Level of Assistance Modified independent, requires aide device or extra time  Assistive Device None  Mobility visit 1 Mobility  Mobility Specialist Start Time (ACUTE ONLY) 1109  Mobility Specialist Stop Time (ACUTE ONLY) 1128  Mobility Specialist Time Calculation (min) (ACUTE ONLY) 19 min   Pt ambulating in hallway with no assistance, MD Williams request for pt to be brought back to room. MS found pt at medical mall, pt then demanded he would not comply until MS assisted him to cafeteria to get diet coke. Pt bought himself 2 diet cokes despite being reminded they are not apart of the diet. Returned to room, care team notified. Left pt in room with MD Trudy and 2 diet cokes with Darice, RN per MD. All needs met.  Sherrilee Ditty Mobility Specialist Please contact via Special Educational Needs Teacher or  Rehab office at [phone]

## 2024-05-25 NOTE — Consult Note (Signed)
 PHARMACY - ANTICOAGULATION CONSULT NOTE  Pharmacy Consult for heparin  infusion Indication: atrial fibrillation  Patient Measurements: Height: 6' (182.9 cm) Weight: 115.6 kg (254 lb 13.6 oz) IBW/kg (Calculated) : 77.6 HEPARIN  DW (KG): 100.6  Vital Signs: Temp: 98.7 F (37.1 C) (02/04 0348) BP: 126/113 (02/04 0348) Pulse Rate: 84 (02/04 0348)  Labs: Recent Labs    05/23/24 0432 05/24/24 0233 05/24/24 0847 05/25/24 0513  HGB 11.6* 10.0*  --  9.2*  HCT 35.9* 30.2*  --  28.4*  PLT 160 148*  --  142*  HEPARINUNFRC  --  0.39 0.45 0.36  CREATININE  --  2.76*  --   --     Estimated Creatinine Clearance: 36.4 mL/min (A) (by C-G formula based on SCr of 2.76 mg/dL (H)).  Medications:  Apixaban  PTA - last dose 1/27 in AM  Assessment: Patient admitted with abdominal pain and concern of small bowel obstruction. PMH includes a fib on Apixaban  PTA, hx of stroke in 2019, diabetes, hypertension, and sleep apnea.  2/2 PM: per surgery, OK to restart heparin  8 hours after procedure at 8 PM tonight  2/3 @ 0847 HL = 0.45 2/4 0513 HL = 0.36  Goal of Therapy:  Heparin  level 0.3-0.7 units/ml Monitor platelets by anticoagulation protocol: Yes   Plan:  - HL therapeutic x 3 after restart - Continue heparin  infusion at 1850 units/hr - Daily HL - Continue to monitor H&H and platelets  Rankin CANDIE Dills, PharmD, [facility] 05/25/2024 6:12 AM

## 2024-05-25 NOTE — Progress Notes (Signed)
 Mobility Specialist Progress Note:    05/25/24 1038  Mobility  Activity Ambulated with assistance  Level of Assistance Modified independent, requires aide device or extra time  Assistive Device None  Distance Ambulated (ft) 20 ft  Range of Motion/Exercises Active;All extremities  Activity Response Tolerated well  Mobility visit 1 Mobility  Mobility Specialist Start Time (ACUTE ONLY) 1022  Mobility Specialist Stop Time (ACUTE ONLY) 1034  Mobility Specialist Time Calculation (min) (ACUTE ONLY) 12 min   Pt received in room, ModI to stand and ambulate with no AD. Assisted w/ donning jacket and shoes for ambulation. Lab at bedside, left in room with all needs met.  Sherrilee Ditty Mobility Specialist Please contact via Special Educational Needs Teacher or  Rehab office at [phone]

## 2024-05-26 ENCOUNTER — Other Ambulatory Visit: Payer: Self-pay

## 2024-05-26 DIAGNOSIS — K56609 Unspecified intestinal obstruction, unspecified as to partial versus complete obstruction: Secondary | ICD-10-CM | POA: Diagnosis not present

## 2024-05-26 LAB — BASIC METABOLIC PANEL WITH GFR
Anion gap: 11 (ref 5–15)
BUN: 37 mg/dL — ABNORMAL HIGH (ref 8–23)
CO2: 29 mmol/L (ref 22–32)
Calcium: 8.7 mg/dL — ABNORMAL LOW (ref 8.9–10.3)
Chloride: 100 mmol/L (ref 98–111)
Creatinine, Ser: 2.13 mg/dL — ABNORMAL HIGH (ref 0.61–1.24)
GFR, Estimated: 34 mL/min — ABNORMAL LOW
Glucose, Bld: 106 mg/dL — ABNORMAL HIGH (ref 70–99)
Potassium: 3.4 mmol/L — ABNORMAL LOW (ref 3.5–5.1)
Sodium: 140 mmol/L (ref 135–145)

## 2024-05-26 LAB — CBC
HCT: 32.2 % — ABNORMAL LOW (ref 39.0–52.0)
Hemoglobin: 10 g/dL — ABNORMAL LOW (ref 13.0–17.0)
MCH: 32.3 pg (ref 26.0–34.0)
MCHC: 31.1 g/dL (ref 30.0–36.0)
MCV: 103.9 fL — ABNORMAL HIGH (ref 80.0–100.0)
Platelets: 170 10*3/uL (ref 150–400)
RBC: 3.1 MIL/uL — ABNORMAL LOW (ref 4.22–5.81)
RDW: 13.8 % (ref 11.5–15.5)
WBC: 8.6 10*3/uL (ref 4.0–10.5)
nRBC: 0 % (ref 0.0–0.2)

## 2024-05-26 LAB — GLUCOSE, CAPILLARY
Glucose-Capillary: 101 mg/dL — ABNORMAL HIGH (ref 70–99)
Glucose-Capillary: 103 mg/dL — ABNORMAL HIGH (ref 70–99)

## 2024-05-26 MED ORDER — METHOCARBAMOL 500 MG PO TABS
500.0000 mg | ORAL_TABLET | Freq: Three times a day (TID) | ORAL | 0 refills | Status: AC | PRN
  Filled 2024-05-26: qty 15, 5d supply, fill #0

## 2024-05-26 MED ORDER — METHOCARBAMOL 500 MG PO TABS
500.0000 mg | ORAL_TABLET | Freq: Three times a day (TID) | ORAL | Status: DC | PRN
  Administered 2024-05-26: 500 mg via ORAL
  Filled 2024-05-26: qty 1

## 2024-05-26 MED ORDER — OXYCODONE-ACETAMINOPHEN 5-325 MG PO TABS
1.0000 | ORAL_TABLET | Freq: Four times a day (QID) | ORAL | 0 refills | Status: AC | PRN
  Filled 2024-05-26: qty 20, 5d supply, fill #0

## 2024-05-26 NOTE — Progress Notes (Signed)
 CC: pSBO Subjective: Doing well, continues flatus. Really wants food No burping, nausea or feelings of bloating  Objective: Vital signs in last 24 hours: Temp:  [98.7 F (37.1 C)-98.8 F (37.1 C)] 98.8 F (37.1 C) (02/05 0350) Pulse Rate:  [76-93] 93 (02/05 0350) Resp:  [17-18] 18 (02/05 0350) BP: (135-149)/(95-97) 135/97 (02/05 0350) SpO2:  [96 %-97 %] 96 % (02/05 0350) Weight:  [116.8 kg] 116.8 kg (02/05 0350) Last BM Date : 05/20/24  Intake/Output from previous day: No intake/output data recorded. Intake/Output this shift: No intake/output data recorded.  Physical exam:  Abdomen is soft, tender to palpation over incision, honeycomb removed and staples intact without any erythema  Lab Results: CBC  Recent Labs    05/25/24 0513 05/26/24 0622  WBC 9.2 8.6  HGB 9.2* 10.0*  HCT 28.4* 32.2*  PLT 142* 170   BMET Recent Labs    05/25/24 1037 05/26/24 0622  NA 144 140  K 3.5 3.4*  CL 101 100  CO2 30 29  GLUCOSE 119* 106*  BUN 45* 37*  CREATININE 2.43* 2.13*  CALCIUM  9.0 8.7*   PT/INR No results for input(s): LABPROT, INR in the last 72 hours. ABG No results for input(s): PHART, HCO3 in the last 72 hours.  Invalid input(s): PCO2, PO2  Studies/Results: No results found.  Anti-infectives: Anti-infectives (From admission, onward)    Start     Dose/Rate Route Frequency Ordered Stop   05/23/24 0915  cefoTEtan  (CEFOTAN ) 2 g in sodium chloride  0.9 % 100 mL IVPB        2 g 200 mL/hr over 30 Minutes Intravenous  Once 05/23/24 0901 05/23/24 0923   05/23/24 0900  cefoTEtan  (CEFOTAN ) 1 g in sodium chloride  0.9 % 100 mL IVPB  Status:  Discontinued        1 g 200 mL/hr over 30 Minutes Intravenous  Once 05/22/24 1122 05/23/24 0901       Assessment/Plan:  POD 3 from ex-lap with mesh explantation, hernia repair, LoA Overall doing well Really wants food, okay to give soft diet today. Discussed with him would like to see him have BM before leaving or  tolerate soft food for 24 hours so likely d/c in AM No abx needed Needs to ambulate   Jayson Endow, M.D. [facility]
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
## 2024-06-09 ENCOUNTER — Encounter: Admitting: General Surgery
